# Patient Record
Sex: Male | Born: 1975 | Hispanic: Yes | Marital: Married | Smoking: Never smoker
Health system: Southern US, Community
[De-identification: ages and names within clinical notes are randomized; demographics above are authoritative.]

---

## 2020-03-10 ENCOUNTER — Other Ambulatory Visit: Payer: Self-pay

## 2020-03-10 ENCOUNTER — Emergency Department
Admission: EM | Admit: 2020-03-10 | Discharge: 2020-03-10 | Disposition: A | Discharge status: Home / Self Care | Payer: HRSA Program | Attending: Emergency Medicine | Admitting: Emergency Medicine

## 2020-03-10 ENCOUNTER — Encounter: Payer: Self-pay | Admitting: Intensive Care

## 2020-03-10 DIAGNOSIS — U071 COVID-19: Secondary | ICD-10-CM | POA: Insufficient documentation

## 2020-03-10 DIAGNOSIS — Z5321 Procedure and treatment not carried out due to patient leaving prior to being seen by health care provider: Secondary | ICD-10-CM | POA: Insufficient documentation

## 2020-03-10 DIAGNOSIS — R05 Cough: Secondary | ICD-10-CM | POA: Diagnosis present

## 2020-03-10 LAB — SARS CORONAVIRUS 2 BY RT PCR (HOSPITAL ORDER, PERFORMED IN ~~LOC~~ HOSPITAL LAB): SARS Coronavirus 2: POSITIVE — AB

## 2020-03-10 MED ORDER — ACETAMINOPHEN 325 MG PO TABS
650.0000 mg | ORAL_TABLET | Freq: Once | ORAL | Status: AC | PRN
  Administered 2020-03-10: 650 mg via ORAL
  Filled 2020-03-10: qty 2

## 2020-03-10 NOTE — ED Triage Notes (Signed)
Patient spanish speaking. Using interpretor on a stick for triage. Patient reports being told Thursday he had a URI. C/o headache, bilateral eye pain, cough, and fever. Was not checked for covid at clinic he was seen at on Thursday.

## 2020-03-14 ENCOUNTER — Telehealth: Payer: Self-pay | Admitting: Emergency Medicine

## 2020-03-14 NOTE — Telephone Encounter (Signed)
Called patient due to lwot to inquire about condition and follow up plans. He had covid test done during triage and it was positive.  Maritza [facility] interpretter was on the call.  She was able to leave a voicemail with my number.

## 2020-03-18 ENCOUNTER — Emergency Department: Payer: HRSA Program

## 2020-03-18 ENCOUNTER — Inpatient Hospital Stay
Admission: EM | Admit: 2020-03-18 | Discharge: 2020-04-26 | DRG: 207 | Disposition: E | Payer: HRSA Program | Attending: Pulmonary Disease | Admitting: Pulmonary Disease

## 2020-03-18 ENCOUNTER — Other Ambulatory Visit: Payer: Self-pay

## 2020-03-18 DIAGNOSIS — J80 Acute respiratory distress syndrome: Secondary | ICD-10-CM | POA: Diagnosis present

## 2020-03-18 DIAGNOSIS — Z7189 Other specified counseling: Secondary | ICD-10-CM

## 2020-03-18 DIAGNOSIS — J1282 Pneumonia due to coronavirus disease 2019: Secondary | ICD-10-CM | POA: Diagnosis present

## 2020-03-18 DIAGNOSIS — R739 Hyperglycemia, unspecified: Secondary | ICD-10-CM | POA: Diagnosis present

## 2020-03-18 DIAGNOSIS — B957 Other staphylococcus as the cause of diseases classified elsewhere: Secondary | ICD-10-CM | POA: Diagnosis not present

## 2020-03-18 DIAGNOSIS — E875 Hyperkalemia: Secondary | ICD-10-CM | POA: Diagnosis present

## 2020-03-18 DIAGNOSIS — N179 Acute kidney failure, unspecified: Secondary | ICD-10-CM | POA: Diagnosis present

## 2020-03-18 DIAGNOSIS — J9600 Acute respiratory failure, unspecified whether with hypoxia or hypercapnia: Secondary | ICD-10-CM

## 2020-03-18 DIAGNOSIS — Z789 Other specified health status: Secondary | ICD-10-CM

## 2020-03-18 DIAGNOSIS — I313 Pericardial effusion (noninflammatory): Secondary | ICD-10-CM | POA: Diagnosis not present

## 2020-03-18 DIAGNOSIS — I248 Other forms of acute ischemic heart disease: Secondary | ICD-10-CM | POA: Diagnosis present

## 2020-03-18 DIAGNOSIS — I214 Non-ST elevation (NSTEMI) myocardial infarction: Secondary | ICD-10-CM

## 2020-03-18 DIAGNOSIS — J9601 Acute respiratory failure with hypoxia: Secondary | ICD-10-CM

## 2020-03-18 DIAGNOSIS — E878 Other disorders of electrolyte and fluid balance, not elsewhere classified: Secondary | ICD-10-CM

## 2020-03-18 DIAGNOSIS — J690 Pneumonitis due to inhalation of food and vomit: Secondary | ICD-10-CM | POA: Diagnosis not present

## 2020-03-18 DIAGNOSIS — E871 Hypo-osmolality and hyponatremia: Secondary | ICD-10-CM

## 2020-03-18 DIAGNOSIS — U071 COVID-19: Principal | ICD-10-CM

## 2020-03-18 DIAGNOSIS — Z515 Encounter for palliative care: Secondary | ICD-10-CM

## 2020-03-18 DIAGNOSIS — Z66 Do not resuscitate: Secondary | ICD-10-CM | POA: Diagnosis not present

## 2020-03-18 DIAGNOSIS — Z9289 Personal history of other medical treatment: Secondary | ICD-10-CM

## 2020-03-18 DIAGNOSIS — D72829 Elevated white blood cell count, unspecified: Secondary | ICD-10-CM

## 2020-03-18 LAB — CBC WITH DIFFERENTIAL/PLATELET
Abs Immature Granulocytes: 0.72 10*3/uL — ABNORMAL HIGH (ref 0.00–0.07)
Basophils Absolute: 0.1 10*3/uL (ref 0.0–0.1)
Basophils Relative: 0 %
Eosinophils Absolute: 0 10*3/uL (ref 0.0–0.5)
Eosinophils Relative: 0 %
HCT: 41.1 % (ref 39.0–52.0)
Hemoglobin: 14.4 g/dL (ref 13.0–17.0)
Immature Granulocytes: 5 %
Lymphocytes Relative: 6 %
Lymphs Abs: 0.9 10*3/uL (ref 0.7–4.0)
MCH: 31 pg (ref 26.0–34.0)
MCHC: 35 g/dL (ref 30.0–36.0)
MCV: 88.4 fL (ref 80.0–100.0)
Monocytes Absolute: 0.6 10*3/uL (ref 0.1–1.0)
Monocytes Relative: 4 %
Neutro Abs: 13.1 10*3/uL — ABNORMAL HIGH (ref 1.7–7.7)
Neutrophils Relative %: 85 %
Platelets: 351 10*3/uL (ref 150–400)
RBC: 4.65 MIL/uL (ref 4.22–5.81)
RDW: 12.9 % (ref 11.5–15.5)
Smear Review: NORMAL
WBC: 14.9 10*3/uL — ABNORMAL HIGH (ref 4.0–10.5)
nRBC: 0.5 % — ABNORMAL HIGH (ref 0.0–0.2)

## 2020-03-18 LAB — COMPREHENSIVE METABOLIC PANEL
ALT: 74 U/L — ABNORMAL HIGH (ref 0–44)
AST: 67 U/L — ABNORMAL HIGH (ref 15–41)
Albumin: 3.3 g/dL — ABNORMAL LOW (ref 3.5–5.0)
Alkaline Phosphatase: 79 U/L (ref 38–126)
Anion gap: 14 (ref 5–15)
BUN: 15 mg/dL (ref 6–20)
CO2: 24 mmol/L (ref 22–32)
Calcium: 8.2 mg/dL — ABNORMAL LOW (ref 8.9–10.3)
Chloride: 89 mmol/L — ABNORMAL LOW (ref 98–111)
Creatinine, Ser: 0.96 mg/dL (ref 0.61–1.24)
GFR calc Af Amer: >60 mL/min (ref >60)
GFR calc non Af Amer: >60 mL/min (ref >60)
Glucose, Bld: 178 mg/dL — ABNORMAL HIGH (ref 70–99)
Potassium: 3.8 mmol/L (ref 3.5–5.1)
Sodium: 127 mmol/L — ABNORMAL LOW (ref 135–145)
Total Bilirubin: 0.8 mg/dL (ref 0.3–1.2)
Total Protein: 7.2 g/dL (ref 6.5–8.1)

## 2020-03-18 LAB — BLOOD GAS, VENOUS
Acid-Base Excess: 5 mmol/L — ABNORMAL HIGH (ref 0.0–2.0)
Bicarbonate: 29.1 mmol/L — ABNORMAL HIGH (ref 20.0–28.0)
O2 Saturation: 42.6 %
Patient temperature: 37
pCO2, Ven: 40 mmHg — ABNORMAL LOW (ref 44.0–60.0)
pH, Ven: 7.47 — ABNORMAL HIGH (ref 7.250–7.430)
pO2, Ven: <31 mmHg — CL (ref 32.0–45.0)

## 2020-03-18 LAB — TROPONIN I (HIGH SENSITIVITY): Troponin I (High Sensitivity): 128 ng/L (ref <18)

## 2020-03-18 LAB — LACTIC ACID, PLASMA: Lactic Acid, Venous: 1.5 mmol/L (ref 0.5–1.9)

## 2020-03-18 LAB — MAGNESIUM: Magnesium: 1.9 mg/dL (ref 1.7–2.4)

## 2020-03-18 LAB — BRAIN NATRIURETIC PEPTIDE: B Natriuretic Peptide: 133.5 pg/mL — ABNORMAL HIGH (ref 0.0–100.0)

## 2020-03-18 LAB — PROCALCITONIN: Procalcitonin: 0.78 ng/mL

## 2020-03-18 MED ORDER — ONDANSETRON HCL 4 MG/2ML IJ SOLN
4.0000 mg | Freq: Four times a day (QID) | INTRAMUSCULAR | Status: DC | PRN
  Filled 2020-03-18 (×2): qty 2

## 2020-03-18 MED ORDER — SODIUM CHLORIDE 0.9 % IV SOLN
200.0000 mg | Freq: Once | INTRAVENOUS | Status: AC
  Administered 2020-03-18: 200 mg via INTRAVENOUS
  Filled 2020-03-18: qty 200

## 2020-03-18 MED ORDER — ASCORBIC ACID 500 MG PO TABS
500.0000 mg | ORAL_TABLET | Freq: Every day | ORAL | Status: DC
  Administered 2020-03-19 – 2020-03-30 (×12): 500 mg via ORAL
  Filled 2020-03-18 (×12): qty 1

## 2020-03-18 MED ORDER — DEXAMETHASONE SODIUM PHOSPHATE 10 MG/ML IJ SOLN
6.0000 mg | INTRAMUSCULAR | Status: AC
  Administered 2020-03-18 – 2020-03-27 (×9): 6 mg via INTRAVENOUS
  Filled 2020-03-18 (×6): qty 0.6
  Filled 2020-03-18: qty 1
  Filled 2020-03-18 (×3): qty 0.6

## 2020-03-18 MED ORDER — ENOXAPARIN SODIUM 40 MG/0.4ML ~~LOC~~ SOLN
40.0000 mg | SUBCUTANEOUS | Status: DC
  Administered 2020-03-19 (×2): 40 mg via SUBCUTANEOUS
  Filled 2020-03-18 (×2): qty 0.4

## 2020-03-18 MED ORDER — SODIUM CHLORIDE 0.9 % IV SOLN
500.0000 mg | INTRAVENOUS | Status: DC
  Administered 2020-03-19 – 2020-03-20 (×3): 500 mg via INTRAVENOUS
  Filled 2020-03-18 (×4): qty 500

## 2020-03-18 MED ORDER — POLYETHYLENE GLYCOL 3350 17 G PO PACK
17.0000 g | PACK | Freq: Every day | ORAL | Status: DC | PRN
  Administered 2020-03-29: 17 g via ORAL
  Filled 2020-03-18: qty 1

## 2020-03-18 MED ORDER — SODIUM CHLORIDE 0.9 % IV SOLN
100.0000 mg | Freq: Every day | INTRAVENOUS | Status: AC
  Administered 2020-03-19 – 2020-03-22 (×4): 100 mg via INTRAVENOUS
  Filled 2020-03-18: qty 100
  Filled 2020-03-18: qty 20
  Filled 2020-03-18 (×2): qty 100

## 2020-03-18 MED ORDER — ALBUTEROL SULFATE HFA 108 (90 BASE) MCG/ACT IN AERS
1.0000 | INHALATION_SPRAY | RESPIRATORY_TRACT | Status: DC | PRN
  Administered 2020-03-22 (×2): 2 via RESPIRATORY_TRACT
  Filled 2020-03-18: qty 6.7

## 2020-03-18 MED ORDER — ZINC SULFATE 220 (50 ZN) MG PO CAPS
220.0000 mg | ORAL_CAPSULE | Freq: Every day | ORAL | Status: DC
  Administered 2020-03-19 – 2020-03-25 (×7): 220 mg via ORAL
  Filled 2020-03-18 (×7): qty 1

## 2020-03-18 MED ORDER — DM-GUAIFENESIN ER 30-600 MG PO TB12
2.0000 | ORAL_TABLET | Freq: Two times a day (BID) | ORAL | Status: DC | PRN
  Administered 2020-03-21 – 2020-03-22 (×2): 2 via ORAL
  Filled 2020-03-18 (×2): qty 2

## 2020-03-18 MED ORDER — DOCUSATE SODIUM 100 MG PO CAPS
100.0000 mg | ORAL_CAPSULE | Freq: Two times a day (BID) | ORAL | Status: DC | PRN
  Administered 2020-03-22 – 2020-03-26 (×2): 100 mg via ORAL
  Filled 2020-03-18 (×3): qty 1

## 2020-03-18 MED ORDER — SODIUM CHLORIDE 0.9 % IV SOLN
1.0000 g | INTRAVENOUS | Status: DC
  Administered 2020-03-19 – 2020-03-20 (×3): 1 g via INTRAVENOUS
  Filled 2020-03-18: qty 1
  Filled 2020-03-18 (×3): qty 10

## 2020-03-18 MED ORDER — BARICITINIB 2 MG PO TABS
4.0000 mg | ORAL_TABLET | Freq: Every day | ORAL | Status: DC
  Administered 2020-03-19 – 2020-03-25 (×8): 4 mg via ORAL
  Filled 2020-03-18 (×6): qty 2
  Filled 2020-03-18: qty 4
  Filled 2020-03-18: qty 2

## 2020-03-18 MED ORDER — IOHEXOL 350 MG/ML SOLN
100.0000 mL | Freq: Once | INTRAVENOUS | Status: AC | PRN
  Administered 2020-03-18: 100 mL via INTRAVENOUS

## 2020-03-18 MED ORDER — BARICITINIB 2 MG PO TABS
2.0000 mg | ORAL_TABLET | Freq: Every day | ORAL | Status: DC
  Filled 2020-03-18: qty 1

## 2020-03-18 MED ORDER — ACETAMINOPHEN 325 MG PO TABS
650.0000 mg | ORAL_TABLET | ORAL | Status: DC | PRN
  Administered 2020-03-24 – 2020-03-29 (×14): 650 mg via ORAL
  Filled 2020-03-18 (×14): qty 2

## 2020-03-18 MED ORDER — ASPIRIN EC 325 MG PO TBEC
325.0000 mg | DELAYED_RELEASE_TABLET | Freq: Every day | ORAL | Status: DC
  Administered 2020-03-19 – 2020-03-25 (×7): 325 mg via ORAL
  Filled 2020-03-18 (×7): qty 1

## 2020-03-18 MED ORDER — ASPIRIN EC 325 MG PO TBEC: 325.0000 mg | DELAYED_RELEASE_TABLET | Freq: Every day | ORAL | Status: DC

## 2020-03-18 NOTE — H&P (Signed)
Name: Donald Mccall MRN: [id number] DOB: 05/09/1976    ADMISSION DATE:  04-02-2020 CONSULTATION DATE:  04/02/2020  REFERRING MD :  Dr. Katrinka Blazing  CHIEF COMPLAINT:  Acute Respiratory Distress  BRIEF PATIENT DESCRIPTION:  44 year old male admitted with acute hypoxic respiratory failure secondary to COVID-19 pneumonia.  SIGNIFICANT EVENTS  8/23: Presented to ED, PCCM asked to admit  STUDIES:  8/23: CTA Chest>>No evidence of pulmonary embolus. Extensive bilateral predominantly ground-glass opacities most compatible with pneumonia, likely COVID pneumonia.  CULTURES: SARS-CoV-2 PCR 8/15>> positive Blood culture x2 8/23>> Strep pneumo urinary antigen 8/23>> Legionella urinary antigen 8/23>  ANTIBIOTICS / ANTIVIRALS: Remdesivir 8/23>> Azithromycin 8/23>> Ceftriaxone 8/23>>  HISTORY OF PRESENT ILLNESS:   Donald Mccall is a 44 y.o. male with no noted past medical history who presents to [facility] ED on 04-02-2020 from home via EMS due to worsening shortness of breath with associated cough, fever, and fatigue.  He was diagnosed with COVID-19 on 03/10/2020.  He reports he has not received the COVID-19 vaccine.  He also endorses some nausea and vomiting, but denies diarrhea.  He denies chest pain, headache, palpitations, headache, sore throat, back pain, edema.  Upon presentation to the ED he was placed on HFNC and  vitals were temperature 100.1 orally, RR 26, HR 117, BP 123/74, and SpO2 89% on HFNC (100% FiO2).  Initial workup revealed WBC 14.0 (with Neutrophilia), procalcitonin 0.78, lactic acid 1.5, sodium 127, chloride 89, glucose 178, AST 67, ALT 74, BNP 133.5, high-sensitivity troponin 128.  CTA chest obtained which was negative for pulmonary embolus, did reveal extensive bilateral groundglass opacities compatible with multifocal pneumonia.  PCCM is asked to admit the patient to stepdown unit for further work-up and treatment of acute hypoxic respiratory failure in the setting of  COVID-19 pneumonia.  PAST MEDICAL HISTORY :   has no past medical history on file.  has no past surgical history on file. Prior to Admission medications   Not on File   No Known Allergies  FAMILY HISTORY:  family history is not on file. SOCIAL HISTORY:  reports that he has never smoked. He has never used smokeless tobacco. He reports previous alcohol use. He reports that he does not use drugs.   COVID-19 DISASTER DECLARATION:  FULL CONTACT PHYSICAL EXAMINATION WAS NOT POSSIBLE DUE TO TREATMENT OF COVID-19 AND  CONSERVATION OF PERSONAL PROTECTIVE EQUIPMENT, LIMITED EXAM FINDINGS INCLUDE-  Patient assessed or the symptoms described in the history of present illness.  In the context of the Global COVID-19 pandemic, which necessitated consideration that the patient might be at risk for infection with the SARS-CoV-2 virus that causes COVID-19, Institutional protocols and algorithms that pertain to the evaluation of patients at risk for COVID-19 are in a state of rapid change based on information released by regulatory bodies including the CDC and federal and state organizations. These policies and algorithms were followed during the patient's care while in hospital.  REVIEW OF SYSTEMS:  Positives in BOLD Constitutional: Negative for +fever, +chills, +weight loss, +malaise/fatigue and diaphoresis.  HENT: Negative for hearing loss, ear pain, nosebleeds, congestion, sore throat, neck pain, tinnitus and ear discharge.   Eyes: Negative for blurred vision, double vision, photophobia, pain, discharge and redness.  Respiratory: Negative for +cough, hemoptysis, +sputum production, +shortness of breath, wheezing and stridor.   Cardiovascular: Negative for chest pain, palpitations, orthopnea, claudication, leg swelling and PND.  Gastrointestinal: Negative for heartburn, +nausea, +vomiting, abdominal pain, diarrhea, constipation, blood in stool and melena.  Genitourinary: Negative for dysuria,  urgency, frequency, hematuria and flank pain.  Musculoskeletal: Negative for +myalgias, back pain, joint pain and falls.  Skin: Negative for itching and rash.  Neurological: Negative for dizziness, tingling, tremors, sensory change, speech change, focal weakness, seizures, loss of consciousness, weakness and headaches.  Endo/Heme/Allergies: Negative for environmental allergies and polydipsia. Does not bruise/bleed easily.  SUBJECTIVE:  Pt reports shortness of breath, cough, malaise/fatigue Denies chest pain, palpitations, abdominal pain, N/V/D currently On 100% BiPAP (tolerating well), being weaned to HFNC  VITAL SIGNS: Temp:  [100.1 F (37.8 C)] 100.1 F (37.8 C) (08/23 2159) Pulse Rate:  [117-122] 117 (08/23 2200) Resp:  [22-26] 26 (08/23 2200) BP: (123)/(74) 123/74 (08/23 2200) SpO2:  [77 %-89 %] 89 % (08/23 2200) FiO2 (%):  [100 %] 100 % (08/23 2200) Weight:  [98 kg] 98 kg (08/23 2201)  PHYSICAL EXAMINATION: General: Acutely ill-appearing male, sitting in bed, on BiPAP, in no acute distress Neuro: Awake, alert and oriented x4, follows commands, no focal deficits, speech clear HEENT: Atraumatic, normocephalic, neck supple, no JVD Cardiovascular: Tachycardia, regular rhythm, 2+ pulses Lungs: Unable to auscultate due to CAPR and PPE, even, mild tachypnea Abdomen: Soft, nontender, nondistended, no guarding or rebound tenderness Musculoskeletal: Normal bulk and tone, no deformities, no edema Skin: Warm and dry.  No obvious rashes, lesions, ulcerations  Recent Labs  Lab 03/09/2020 2204  NA 127*  K 3.8  CL 89*  CO2 24  BUN 15  CREATININE 0.96  GLUCOSE 178*   Recent Labs  Lab 03/17/2020 2204  HGB 14.4  HCT 41.1  WBC 14.9*  PLT 351   CT Angio Chest PE W and/or Wo Contrast  Result Date: 03/01/2020 CLINICAL DATA:  Respiratory distress. EXAM: CT ANGIOGRAPHY CHEST WITH CONTRAST TECHNIQUE: Multidetector CT imaging of the chest was performed using the standard protocol during  bolus administration of intravenous contrast. Multiplanar CT image reconstructions and MIPs were obtained to evaluate the vascular anatomy. CONTRAST:  OMNIPAQUE IOHEXOL 350 MG/ML SOLN COMPARISON:  None. FINDINGS: Cardiovascular: No filling defects in the pulmonary arteries to suggest pulmonary emboli. Heart is normal size. Aorta normal caliber. Mediastinum/Nodes: No mediastinal, hilar, or axillary adenopathy. Trachea and esophagus are unremarkable. Thyroid unremarkable. Lungs/Pleura: Extensive ground-glass airspace opacities throughout both lungs. More confluent consolidation in the right lower lobe. Findings most compatible with pneumonia. No effusions. Upper Abdomen: Imaging into the upper abdomen demonstrates no acute findings. Musculoskeletal: Chest wall soft tissues are unremarkable. No acute bony abnormality. Review of the MIP images confirms the above findings. IMPRESSION: No evidence of pulmonary embolus. Extensive bilateral predominantly ground-glass opacities most compatible with pneumonia, likely COVID pneumonia. Electronically Signed   By: Charlett Nose M.D.   On: 03/26/2020 23:02    ASSESSMENT / PLAN:  Acute Hypoxic Respiratory Failure in the setting of COVID-19 Pneumonia -Supplemental O2 as needed to maintain O2 sats greater than 85% -BiPAP, wean as tolerated -High risk for intubation -Follow intermittent chest x-ray and ABG as needed -CTA chest 8/23 NEGATIVE for PE -As needed bronchodilators via MDI -Antitussives -Pulmonary toilet -Self-proning as tolerated -Maintain euvolemia to net negative fluid status   COVID-19 Pneumonia ? Superimposed CAP -Monitor fever curve -Trend WBCs and procalcitonin -Follow cultures as above -Follow inflammatory markers: Ferritin, D-dimer, CRP, IL-6, LDH -Remdesivir (Day 1 out of 5) -IV Decadron -Vitamin C and zinc -Will place on Baricitinib -Will place on Azithromycin & Ceftriaxone due to elevated Procalcitonin -Maintain Airborne and  Contact precautions  Initiation: - Based on ACTT-2, COV-BARRIER and other available trial data, baricitinib  is being used under EUA by the FDA. The patient has no ESRD or AKI, known history of TB, severe neutropenia (ANC <500) or lymphopenia (ALC <200), or severe LFT elevations. They are not on DMARDs or probenecid, and are not pregnant.  - The option to use/refuse baricitinib treatment under FDA authorization (not approval), the significant known and potential risks and benefits, the extent to which these are unknown, and information regarding all available alternatives were discussed in detail. Specifically, the risk of VTE and secondary infections were discussed in detail with the patient and/or HCPOA. They consent to proceed with treatment.  For patients already started:    - Continue baricitinib, started 03/26/2020, for 14 days or until hospital discharge. Monitor Cr, LFTs, differential. Keep on VTE ppx. Renally dose if necessary (4mg  daily normal renal function, 2mg  daily if CrCl 19ml/min or higher, 1mg  daily if CrCl 47ml/min or higher).    Elevated Troponin, likely demand ischemia vs. NSTEMI -Continuous cardiac monitoring -Maintain MAP greater than 65 -Trend troponin until downtrending -Pt denies chest pain, EKG with nonspecific T wave changes (no evidence of acute ischemia)   Hyponatremia, likely hypotonic hypovolemic in setting of recent history of Nausea/Vomiting Hypochloremia -Monitor I&O's / urinary output -Follow BMP -Ensure adequate renal perfusion -Avoid nephrotoxic agents as able -Replace electrolytes as indicated -Cautious IVF due to Acute Hypoxic Respiratory Failure due to COVID-19       Pt's prognosis is guarded, he is high risk for intubation.  BEST PRACTICES DISPOSITION: STEPDOWN GOALS OF CARE: FULL CODE VTE PROPHYLAXIS: SQ LOVENOX UPDATES: UPDATED PT AT BEDSIDE 03/11/2020 with Hospital Interpreter at bedside.  , AGACNP-BC [facility] Pager: [phone]  03/05/2020, 11:08 PM

## 2020-03-18 NOTE — ED Provider Notes (Signed)
[facility] Emergency Department Provider Note  ____________________________________________   First MD Initiated Contact with Patient 10/29/19 2159     (approximate)  I have reviewed the triage vital signs and the nursing notes.   HISTORY  Chief Complaint Respiratory Distress   HPI Donald Mccall is a 44 y.o. male without significant past medical history who presents via EMS from home for worsening shortness of breath associated with cough, fever, and fatigue that is all been getting worse since he was diagnosed with Covid on 8/15.  Patient also endorses some nausea and vomiting but denies any diarrhea.  He denies any chest pain, dental pain, headache, earache, sore throat, back pain, extremity pain, rash, or acute complaints.  No clear alleviating or aggravating factors.  Patient states he has not been immunized.  He denies tobacco use, EtOH use, illicit drug use.  Denies taking any medications on a regular basis.  No other acute complaints at this time.  Patient is Spanish-speaking and history was obtained with Spanish-speaking nurse at bedside.         History reviewed. No pertinent past medical history.  Patient Active Problem List   Diagnosis Date Noted  . Pneumonia due to COVID-19 virus Dec 21, 2019    History reviewed. No pertinent surgical history.  Prior to Admission medications   Not on File    Allergies Patient has no known allergies.  History reviewed. No pertinent family history.  Social History Social History   Tobacco Use  . Smoking status: Never Smoker  . Smokeless tobacco: Never Used  Substance Use Topics  . Alcohol use: Not Currently  . Drug use: Never    Review of Systems  Review of Systems  Constitutional: Positive for chills, diaphoresis, fever, malaise/fatigue and weight loss.  HENT: Positive for congestion. Negative for sore throat.   Eyes: Negative for pain.  Respiratory: Positive for cough and sputum  production. Negative for stridor.   Cardiovascular: Positive for palpitations. Negative for chest pain.  Gastrointestinal: Positive for nausea and vomiting.  Musculoskeletal: Positive for myalgias.  Skin: Negative for rash.  Neurological: Positive for headaches. Negative for seizures and loss of consciousness.  Psychiatric/Behavioral: Negative for suicidal ideas.  All other systems reviewed and are negative.     ____________________________________________   PHYSICAL EXAM:  VITAL SIGNS: ED Triage Vitals [10/29/19 2159]  Enc Vitals Group     BP      Pulse      Resp      Temp      Temp src      SpO2 (!) 77 %     Weight      Height      Head Circumference      Peak Flow      Pain Score      Pain Loc      Pain Edu?      Excl. in GC?    Vitals:   10/29/19 2159 10/29/19 2200  BP:  123/74  Pulse: (!) 122 (!) 117  Resp: (!) 22 (!) 26  Temp: 100.1 F (37.8 C)   SpO2: (!) 77% (!) 89%   Physical Exam Vitals and nursing note reviewed.  Constitutional:      General: He is in acute distress.     Appearance: He is well-developed.  HENT:     Head: Normocephalic and atraumatic.     Right Ear: External ear normal.     Left Ear: External ear normal.  Nose: Nose normal.     Mouth/Throat:     Mouth: Mucous membranes are dry.  Eyes:     Conjunctiva/sclera: Conjunctivae normal.  Cardiovascular:     Rate and Rhythm: Regular rhythm. Tachycardia present.     Heart sounds: No murmur heard.   Pulmonary:     Effort: Tachypnea, accessory muscle usage and respiratory distress present.     Breath sounds: Examination of the right-middle field reveals rhonchi. Examination of the left-middle field reveals rhonchi. Examination of the right-lower field reveals rhonchi. Examination of the left-lower field reveals rhonchi. Rhonchi present.  Abdominal:     Palpations: Abdomen is soft.     Tenderness: There is no abdominal tenderness. There is no right CVA tenderness or left CVA  tenderness.  Musculoskeletal:     Cervical back: Neck supple.     Right lower leg: No edema.     Left lower leg: No edema.  Skin:    General: Skin is warm and dry.  Neurological:     Mental Status: He is alert and oriented to person, place, and time.  Psychiatric:        Mood and Affect: Mood normal.      ____________________________________________   LABS (all labs ordered are listed, but only abnormal results are displayed)  Labs Reviewed  CBC WITH DIFFERENTIAL/PLATELET - Abnormal; Notable for the following components:      Result Value   WBC 14.9 (*)    nRBC 0.5 (*)    Neutro Abs 13.1 (*)    Abs Immature Granulocytes 0.72 (*)    All other components within normal limits  COMPREHENSIVE METABOLIC PANEL - Abnormal; Notable for the following components:   Sodium 127 (*)    Chloride 89 (*)    Glucose, Bld 178 (*)    Calcium 8.2 (*)    Albumin 3.3 (*)    AST 67 (*)    ALT 74 (*)    All other components within normal limits  BRAIN NATRIURETIC PEPTIDE - Abnormal; Notable for the following components:   B Natriuretic Peptide 133.5 (*)    All other components within normal limits  BLOOD GAS, VENOUS - Abnormal; Notable for the following components:   pH, Ven 7.47 (*)    pCO2, Ven 40 (*)    pO2, Ven <31.0 (*)    Bicarbonate 29.1 (*)    Acid-Base Excess 5.0 (*)    All other components within normal limits  TROPONIN I (HIGH SENSITIVITY) - Abnormal; Notable for the following components:   Troponin I (High Sensitivity) 128 (*)    All other components within normal limits  CULTURE, BLOOD (ROUTINE X 2)  CULTURE, BLOOD (ROUTINE X 2)  MAGNESIUM  PROCALCITONIN  LACTIC ACID, PLASMA  LACTIC ACID, PLASMA  PROTIME-INR  APTT  URINALYSIS, COMPLETE (UACMP) WITH MICROSCOPIC  C-REACTIVE PROTEIN  PROCALCITONIN  CBC WITH DIFFERENTIAL/PLATELET  COMPREHENSIVE METABOLIC PANEL  FIBRIN DERIVATIVES D-DIMER ([facility] ONLY)  C-REACTIVE PROTEIN  FERRITIN  HIV ANTIBODY (ROUTINE TESTING W  REFLEX)   ____________________________________________  EKG  Sinus tachycardia with a ventricular rate of 120 with very poor baseline tracings and borderline prolonged QTC with normal axis, nonspecific T wave changes, no other clear evidence of acute ischemia or other significant underlying arrhythmia. ____________________________________________  RADIOLOGY  ED MD interpretation: No PE.  Bilateral groundglass opacities.  No evidence of dissection, aneurysm, or other acute intrathoracic process.  Official radiology report(s): CT Angio Chest PE W and/or Wo Contrast  Result Date: 2020-04-14 CLINICAL DATA:  Respiratory distress. EXAM: CT ANGIOGRAPHY CHEST WITH CONTRAST TECHNIQUE: Multidetector CT imaging of the chest was performed using the standard protocol during bolus administration of intravenous contrast. Multiplanar CT image reconstructions and MIPs were obtained to evaluate the vascular anatomy. CONTRAST:  OMNIPAQUE IOHEXOL 350 MG/ML SOLN COMPARISON:  None. FINDINGS: Cardiovascular: No filling defects in the pulmonary arteries to suggest pulmonary emboli. Heart is normal size. Aorta normal caliber. Mediastinum/Nodes: No mediastinal, hilar, or axillary adenopathy. Trachea and esophagus are unremarkable. Thyroid unremarkable. Lungs/Pleura: Extensive ground-glass airspace opacities throughout both lungs. More confluent consolidation in the right lower lobe. Findings most compatible with pneumonia. No effusions. Upper Abdomen: Imaging into the upper abdomen demonstrates no acute findings. Musculoskeletal: Chest wall soft tissues are unremarkable. No acute bony abnormality. Review of the MIP images confirms the above findings. IMPRESSION: No evidence of pulmonary embolus. Extensive bilateral predominantly ground-glass opacities most compatible with pneumonia, likely COVID pneumonia. Electronically Signed   By: Charlett Nose M.D.   On: 2020-04-16 23:02     ____________________________________________   PROCEDURES  Procedure(s) performed (including Critical Care):  .1-3 Lead EKG Interpretation Performed by: Gilles Chiquito, MD Authorized by: Gilles Chiquito, MD     Interpretation: normal     ECG rate assessment: tachycardic     Rhythm: sinus rhythm     Ectopy: none     Conduction: normal   .Critical Care Performed by: Gilles Chiquito, MD Authorized by: Gilles Chiquito, MD   Critical care provider statement:    Critical care time (minutes):  75   Critical care time was exclusive of:  Separately billable procedures and treating other patients   Critical care was necessary to treat or prevent imminent or life-threatening deterioration of the following conditions:  Respiratory failure and dehydration   Critical care was time spent personally by me on the following activities:  Discussions with consultants, evaluation of patient's response to treatment, examination of patient, ordering and performing treatments and interventions, ordering and review of laboratory studies, ordering and review of radiographic studies, pulse oximetry, re-evaluation of patient's condition, obtaining history from patient or surrogate and review of old charts     ____________________________________________   INITIAL IMPRESSION / ASSESSMENT AND PLAN / ED COURSE        Overall patient's history, exam, and ED work-up is concerning for acute hypoxic respiratory failure and NSTEMI secondary to Covid pneumonia.  Patient is also noted to be hyponatremic.  There is no evidence of PE on CTA chest, dissection, aneurysm, pneumothorax, or significant effusion.  Patient is not acutely symptomatic anemia based on hemoglobin WNL.  Elevated troponin likely secondary to demand ischemia in the setting of severe Covid infection.  VBG does not show evidence of hypercarbic respiratory failure.  BNP is slightly elevated at 133.  Unclear etiology for patient's  hyponatremia although NA is 127 he is also noted to be hypochloremic at 189.  It is possible this is secondary to GI losses.  Patient was immediately placed on high flow oxygen supplementation as well as nonrebreather.  Below noted medications initiated in the ED.  We will plan to admit to medical ICU for further evaluation management of COVID-19 pneumonia and severe hypoxic respiratory failure with NSTEMI and metabolic derangements.  Medications  dexamethasone (DECADRON) injection 6 mg (6 mg Intravenous Given 04-16-20 2217)  remdesivir 200 mg in sodium chloride 0.9% 250 mL IVPB (has no administration in time range)    Followed by  remdesivir 100 mg in sodium chloride 0.9 %  100 mL IVPB (has no administration in time range)  ascorbic acid (VITAMIN C) tablet 500 mg (has no administration in time range)  zinc sulfate capsule 220 mg (has no administration in time range)  baricitinib (OLUMIANT) tablet 2 mg (has no administration in time range)  albuterol (VENTOLIN HFA) 108 (90 Base) MCG/ACT inhaler 1-2 puff (has no administration in time range)  dextromethorphan-guaiFENesin (MUCINEX DM) 30-600 MG per 12 hr tablet 2 tablet (has no administration in time range)  aspirin EC tablet 325 mg (has no administration in time range)  docusate sodium (COLACE) capsule 100 mg (has no administration in time range)  polyethylene glycol (MIRALAX / GLYCOLAX) packet 17 g (has no administration in time range)  enoxaparin (LOVENOX) injection 40 mg (has no administration in time range)  acetaminophen (TYLENOL) tablet 650 mg (has no administration in time range)  ondansetron (ZOFRAN) injection 4 mg (has no administration in time range)  iohexol (OMNIPAQUE) 350 MG/ML injection 100 mL (100 mLs Intravenous Contrast Given 03/23/20 2225)   ____________________________________________   FINAL CLINICAL IMPRESSION(S) / ED DIAGNOSES  Final diagnoses:  COVID-19  Acute respiratory failure with hypoxia (HCC)  NSTEMI (non-ST  elevated myocardial infarction) (HCC)  Hyponatremia  Hypochloremia  Leukocytosis, unspecified type     ED Discharge Orders    None       Note:  This document was prepared using Dragon voice recognition software and may include unintentional dictation errors.   Gilles Chiquito, MD 03-23-2020 [phone]

## 2020-03-18 NOTE — ED Triage Notes (Signed)
Pt arrives from home via [facility] with complaint of respiratory distress. Pt RA sat was in the 50's per EMS, with increase to 70's 15L NRB.  Pt satting 81% NRB on arrival. Family reports symptoms for worsened over past 3 days. Pt with labored breathing on arrival Pt reports n/v, denies diarrhea   HR 120 BP 120/84   Pt reports testing covid positive last monday

## 2020-03-18 NOTE — ED Notes (Signed)
Pt placed on heated HFNC, with improvement in breathing.  Pt still dyspneic, with SpO2 ranging from 87-91%

## 2020-03-18 NOTE — Progress Notes (Signed)
Remdesivir - Pharmacy Brief Note   O:  CXR: pending SpO2: 88% on HFNC   A/P:  Remdesivir 200 mg IVPB once followed by 100 mg IVPB daily x 4 days.   Thomasene Ripple, PharmD, BCPS Clinical Pharmacist 03/21/2020 10:11 PM

## 2020-03-19 LAB — CBC WITH DIFFERENTIAL/PLATELET
Abs Immature Granulocytes: 0.62 10*3/uL — ABNORMAL HIGH (ref 0.00–0.07)
Basophils Absolute: 0.1 10*3/uL (ref 0.0–0.1)
Basophils Relative: 0 %
Eosinophils Absolute: 0 10*3/uL (ref 0.0–0.5)
Eosinophils Relative: 0 %
HCT: 41.5 % (ref 39.0–52.0)
Hemoglobin: 14.7 g/dL (ref 13.0–17.0)
Immature Granulocytes: 4 %
Lymphocytes Relative: 3 %
Lymphs Abs: 0.6 10*3/uL — ABNORMAL LOW (ref 0.7–4.0)
MCH: 30.8 pg (ref 26.0–34.0)
MCHC: 35.4 g/dL (ref 30.0–36.0)
MCV: 86.8 fL (ref 80.0–100.0)
Monocytes Absolute: 0.4 10*3/uL (ref 0.1–1.0)
Monocytes Relative: 3 %
Neutro Abs: 15.1 10*3/uL — ABNORMAL HIGH (ref 1.7–7.7)
Neutrophils Relative %: 90 %
Platelets: 344 10*3/uL (ref 150–400)
RBC: 4.78 MIL/uL (ref 4.22–5.81)
RDW: 13 % (ref 11.5–15.5)
WBC: 16.8 10*3/uL — ABNORMAL HIGH (ref 4.0–10.5)
nRBC: 0.4 % — ABNORMAL HIGH (ref 0.0–0.2)

## 2020-03-19 LAB — URINALYSIS, COMPLETE (UACMP) WITH MICROSCOPIC
Bacteria, UA: NONE SEEN
Bilirubin Urine: NEGATIVE
Glucose, UA: NEGATIVE mg/dL
Hgb urine dipstick: NEGATIVE
Ketones, ur: NEGATIVE mg/dL
Leukocytes,Ua: NEGATIVE
Nitrite: NEGATIVE
Protein, ur: NEGATIVE mg/dL
Specific Gravity, Urine: 1.036 — ABNORMAL HIGH (ref 1.005–1.030)
Squamous Epithelial / HPF: NONE SEEN (ref 0–5)
pH: 5 (ref 5.0–8.0)

## 2020-03-19 LAB — MAGNESIUM: Magnesium: 2.3 mg/dL (ref 1.7–2.4)

## 2020-03-19 LAB — COMPREHENSIVE METABOLIC PANEL
ALT: 68 U/L — ABNORMAL HIGH (ref 0–44)
AST: 59 U/L — ABNORMAL HIGH (ref 15–41)
Albumin: 3.2 g/dL — ABNORMAL LOW (ref 3.5–5.0)
Alkaline Phosphatase: 89 U/L (ref 38–126)
Anion gap: 12 (ref 5–15)
BUN: 14 mg/dL (ref 6–20)
CO2: 28 mmol/L (ref 22–32)
Calcium: 8.3 mg/dL — ABNORMAL LOW (ref 8.9–10.3)
Chloride: 94 mmol/L — ABNORMAL LOW (ref 98–111)
Creatinine, Ser: 0.93 mg/dL (ref 0.61–1.24)
GFR calc Af Amer: >60 mL/min (ref >60)
GFR calc non Af Amer: >60 mL/min (ref >60)
Glucose, Bld: 202 mg/dL — ABNORMAL HIGH (ref 70–99)
Potassium: 4.4 mmol/L (ref 3.5–5.1)
Sodium: 134 mmol/L — ABNORMAL LOW (ref 135–145)
Total Bilirubin: 0.6 mg/dL (ref 0.3–1.2)
Total Protein: 7 g/dL (ref 6.5–8.1)

## 2020-03-19 LAB — C-REACTIVE PROTEIN
CRP: 18.2 mg/dL — ABNORMAL HIGH (ref <1.0)
CRP: 20.8 mg/dL — ABNORMAL HIGH (ref <1.0)

## 2020-03-19 LAB — FIBRIN DERIVATIVES D-DIMER (ARMC ONLY): Fibrin derivatives D-dimer (ARMC): 1490.65 ng/mL (FEU) — ABNORMAL HIGH (ref 0.00–499.00)

## 2020-03-19 LAB — PROTIME-INR
INR: 1.1 (ref 0.8–1.2)
Prothrombin Time: 13.4 seconds (ref 11.4–15.2)

## 2020-03-19 LAB — PROCALCITONIN: Procalcitonin: 0.93 ng/mL

## 2020-03-19 LAB — APTT: aPTT: 27 seconds (ref 24–36)

## 2020-03-19 LAB — TROPONIN I (HIGH SENSITIVITY): Troponin I (High Sensitivity): 88 ng/L — ABNORMAL HIGH (ref <18)

## 2020-03-19 LAB — FERRITIN: Ferritin: 3672 ng/mL — ABNORMAL HIGH (ref 24–336)

## 2020-03-19 LAB — LACTIC ACID, PLASMA: Lactic Acid, Venous: 1.2 mmol/L (ref 0.5–1.9)

## 2020-03-19 LAB — STREP PNEUMONIAE URINARY ANTIGEN: Strep Pneumo Urinary Antigen: NEGATIVE

## 2020-03-19 LAB — PHOSPHORUS: Phosphorus: 3.1 mg/dL (ref 2.5–4.6)

## 2020-03-19 LAB — HIV ANTIBODY (ROUTINE TESTING W REFLEX): HIV Screen 4th Generation wRfx: NONREACTIVE

## 2020-03-19 MED ORDER — CHLORHEXIDINE GLUCONATE CLOTH 2 % EX PADS
6.0000 | MEDICATED_PAD | Freq: Every day | CUTANEOUS | Status: AC
  Administered 2020-03-22 – 2020-03-23 (×2): 6 via TOPICAL
  Filled 2020-03-19 (×2): qty 6

## 2020-03-19 MED ORDER — CHLORHEXIDINE GLUCONATE CLOTH 2 % EX PADS
6.0000 | MEDICATED_PAD | Freq: Every day | CUTANEOUS | Status: DC
  Administered 2020-03-22 – 2020-03-30 (×7): 6 via TOPICAL

## 2020-03-19 MED ORDER — MUPIROCIN 2 % EX OINT
1.0000 "application " | TOPICAL_OINTMENT | Freq: Two times a day (BID) | CUTANEOUS | Status: AC
  Administered 2020-03-19 – 2020-03-23 (×10): 1 via NASAL
  Filled 2020-03-19: qty 22

## 2020-03-19 NOTE — ED Notes (Signed)
Pt tolerating bipap well, states his breathing has improved.

## 2020-03-19 NOTE — ED Notes (Signed)
Pt placed back on bipap per inability to maintain spo2 above 89%  Pt acknowledges wife's request to update on his status, says he has a phone with signal and will update her. Declines this RN updating with interpreter

## 2020-03-19 NOTE — ED Notes (Signed)
Pt appears to be sleeping and comfortable at this time.  

## 2020-03-19 NOTE — ED Notes (Signed)
No change in condition, pt awaiting hospital bed availability.  

## 2020-03-19 NOTE — ED Notes (Signed)
NRB added onto HFNC per spo2 of 88-90

## 2020-03-19 NOTE — Progress Notes (Signed)
Pharmacy Electrolyte Monitoring Consult:  Pharmacy consulted to assist in monitoring and replacing electrolytes in this 44 y.o. male admitted on 03/20/2020 with Respiratory Distress   Labs:  Sodium (mmol/L)  Date Value  03/19/2020 134 (L)   Potassium (mmol/L)  Date Value  03/19/2020 4.4   Magnesium (mg/dL)  Date Value  43/60/6770 2.3   Phosphorus (mg/dL)  Date Value  34/09/5246 3.1   Calcium (mg/dL)  Date Value  18/59/0931 8.3 (L)   Albumin (g/dL)  Date Value  07/11/2445 3.2 (L)    Assessment/Plan: Goal: electrolytes WNL  Electrolytes WNL. No replacement indicated at this time. Continue to monitor.  Laureen Ochs, PharmD Clinical Pharmacist 03/19/2020 11:59 AM

## 2020-03-19 NOTE — Progress Notes (Signed)
Pharmacy Electrolyte Monitoring Consult:  Pharmacy consulted to assist in monitoring and replacing electrolytes in this 44 y.o. male admitted on 19-Mar-2020 with Respiratory Distress   Labs:  Sodium (mmol/L)  Date Value  2020-03-19 127 (L)   Potassium (mmol/L)  Date Value  2020/03/19 3.8   Magnesium (mg/dL)  Date Value  94/85/4627 1.9   Calcium (mg/dL)  Date Value  03/50/0938 8.2 (L)   Albumin (g/dL)  Date Value  18/29/9371 3.3 (L)    Assessment/Plan: Goal Na 135 - 145 K > 4.0 Mg > 2.0 Phos 2.5 - 4.5  Patient is currently hyponatremic not currently on fluids d/t COVID pneumonia w/ high O2 requirements (89 - 95% O2 on 55L HFNC 100% FiO2) for fear of pulmonary edema; however, on dexamethasone, which may help to increase sodium. K WNL, Mg 1.9 WNL, Mg/Phos pending for am labs will continue to monitor.  Thomasene Ripple, PharmD, BCPS Clinical Pharmacist 03/19/2020 3:34 AM

## 2020-03-19 NOTE — ED Notes (Signed)
Report received from Noah RN. Patient care assumed. Patient/RN introduction complete. Will continue to monitor.  

## 2020-03-19 NOTE — ED Notes (Signed)
Pt taken off bipap, placed on HFNC per pt request

## 2020-03-19 NOTE — Progress Notes (Signed)
CODE SEPSIS - PHARMACY COMMUNICATION  **Broad Spectrum Antibiotics should be administered within 1 hour of Sepsis diagnosis**  Time Code Sepsis Called/Page Received: 2202  Antibiotics Ordered: azithro/ceftriaxone  Time of 1st antibiotic administration: 0019  Additional action taken by pharmacy:   If necessary, Name of Provider/Nurse Contacted:     Thomasene Ripple ,PharmD Clinical Pharmacist  03/19/2020  2:44 AM

## 2020-03-20 DIAGNOSIS — J9601 Acute respiratory failure with hypoxia: Secondary | ICD-10-CM

## 2020-03-20 LAB — CBC WITH DIFFERENTIAL/PLATELET
Abs Immature Granulocytes: 0.45 10*3/uL — ABNORMAL HIGH (ref 0.00–0.07)
Basophils Absolute: 0.1 10*3/uL (ref 0.0–0.1)
Basophils Relative: 1 %
Eosinophils Absolute: 0 10*3/uL (ref 0.0–0.5)
Eosinophils Relative: 0 %
HCT: 40.8 % (ref 39.0–52.0)
Hemoglobin: 14.2 g/dL (ref 13.0–17.0)
Immature Granulocytes: 4 %
Lymphocytes Relative: 4 %
Lymphs Abs: 0.5 10*3/uL — ABNORMAL LOW (ref 0.7–4.0)
MCH: 30.9 pg (ref 26.0–34.0)
MCHC: 34.8 g/dL (ref 30.0–36.0)
MCV: 88.7 fL (ref 80.0–100.0)
Monocytes Absolute: 0.5 10*3/uL (ref 0.1–1.0)
Monocytes Relative: 4 %
Neutro Abs: 9.8 10*3/uL — ABNORMAL HIGH (ref 1.7–7.7)
Neutrophils Relative %: 87 %
Platelets: 414 10*3/uL — ABNORMAL HIGH (ref 150–400)
RBC: 4.6 MIL/uL (ref 4.22–5.81)
RDW: 13.3 % (ref 11.5–15.5)
WBC: 11.3 10*3/uL — ABNORMAL HIGH (ref 4.0–10.5)
nRBC: 0.6 % — ABNORMAL HIGH (ref 0.0–0.2)

## 2020-03-20 LAB — COMPREHENSIVE METABOLIC PANEL
ALT: 60 U/L — ABNORMAL HIGH (ref 0–44)
AST: 43 U/L — ABNORMAL HIGH (ref 15–41)
Albumin: 3.1 g/dL — ABNORMAL LOW (ref 3.5–5.0)
Alkaline Phosphatase: 88 U/L (ref 38–126)
Anion gap: 12 (ref 5–15)
BUN: 22 mg/dL — ABNORMAL HIGH (ref 6–20)
CO2: 28 mmol/L (ref 22–32)
Calcium: 8.7 mg/dL — ABNORMAL LOW (ref 8.9–10.3)
Chloride: 97 mmol/L — ABNORMAL LOW (ref 98–111)
Creatinine, Ser: 0.75 mg/dL (ref 0.61–1.24)
GFR calc Af Amer: >60 mL/min (ref >60)
GFR calc non Af Amer: >60 mL/min (ref >60)
Glucose, Bld: 204 mg/dL — ABNORMAL HIGH (ref 70–99)
Potassium: 4.9 mmol/L (ref 3.5–5.1)
Sodium: 137 mmol/L (ref 135–145)
Total Bilirubin: 0.9 mg/dL (ref 0.3–1.2)
Total Protein: 7.3 g/dL (ref 6.5–8.1)

## 2020-03-20 LAB — LEGIONELLA PNEUMOPHILA SEROGP 1 UR AG: L. pneumophila Serogp 1 Ur Ag: NEGATIVE

## 2020-03-20 LAB — MAGNESIUM: Magnesium: 2.7 mg/dL — ABNORMAL HIGH (ref 1.7–2.4)

## 2020-03-20 LAB — PHOSPHORUS: Phosphorus: 2.9 mg/dL (ref 2.5–4.6)

## 2020-03-20 LAB — C-REACTIVE PROTEIN: CRP: 19.7 mg/dL — ABNORMAL HIGH (ref <1.0)

## 2020-03-20 LAB — PROCALCITONIN: Procalcitonin: 0.58 ng/mL

## 2020-03-20 LAB — FERRITIN: Ferritin: 3147 ng/mL — ABNORMAL HIGH (ref 24–336)

## 2020-03-20 LAB — MRSA PCR SCREENING: MRSA by PCR: POSITIVE — AB

## 2020-03-20 LAB — FIBRIN DERIVATIVES D-DIMER (ARMC ONLY): Fibrin derivatives D-dimer (ARMC): 1998.31 ng/mL (FEU) — ABNORMAL HIGH (ref 0.00–499.00)

## 2020-03-20 MED ORDER — ENOXAPARIN SODIUM 60 MG/0.6ML ~~LOC~~ SOLN
0.5000 mg/kg | SUBCUTANEOUS | Status: DC
  Administered 2020-03-20 – 2020-03-26 (×6): 47.5 mg via SUBCUTANEOUS
  Filled 2020-03-20 (×8): qty 0.6

## 2020-03-20 MED ORDER — PANTOPRAZOLE SODIUM 40 MG IV SOLR
40.0000 mg | Freq: Every day | INTRAVENOUS | Status: DC
  Administered 2020-03-20 – 2020-03-30 (×11): 40 mg via INTRAVENOUS
  Filled 2020-03-20 (×11): qty 40

## 2020-03-20 MED ORDER — TRAZODONE HCL 50 MG PO TABS
50.0000 mg | ORAL_TABLET | Freq: Every evening | ORAL | Status: DC | PRN
  Administered 2020-03-20 – 2020-03-22 (×2): 50 mg via ORAL
  Filled 2020-03-20 (×3): qty 1

## 2020-03-20 MED ORDER — ORAL CARE MOUTH RINSE
15.0000 mL | Freq: Two times a day (BID) | OROMUCOSAL | Status: DC
  Administered 2020-03-21 – 2020-03-22 (×3): 15 mL via OROMUCOSAL

## 2020-03-20 NOTE — Progress Notes (Signed)
PHARMACIST - PHYSICIAN COMMUNICATION  CONCERNING:  Enoxaparin (Lovenox) for DVT Prophylaxis    RECOMMENDATION: Patient was prescribed enoxaprin 40mg  q24 hours for VTE prophylaxis.   Filed Weights   04/11/20 2201 03/19/20 1809  Weight: 98 kg (216 lb) 92.8 kg (204 lb 9.4 oz)    Body mass index is 31.11 kg/m.  Estimated Creatinine Clearance: 131.7 mL/min (by C-G formula based on SCr of 0.75 mg/dL).   Based on [facility] policy patient is candidate for enoxaparin 0.5 mg/kg every 24 hours due to BMI > 30.   DESCRIPTION: Pharmacy has adjusted enoxaparin dose per [facility] policy.  Patient is now receiving enoxaparin 47.5 mg every 24 hours    [facility], PharmD Clinical Pharmacist  03/20/2020 1:58 PM

## 2020-03-20 NOTE — Progress Notes (Signed)
Pharmacy Electrolyte Monitoring Consult:  Pharmacy consulted to assist in monitoring and replacing electrolytes in this 44 y.o. male admitted on 03/26/2020 with Respiratory Distress   Labs:  Sodium (mmol/L)  Date Value  03/20/2020 137   Potassium (mmol/L)  Date Value  03/20/2020 4.9   Magnesium (mg/dL)  Date Value  60/67/7034 2.7 (H)   Phosphorus (mg/dL)  Date Value  03/52/4818 2.9   Calcium (mg/dL)  Date Value  59/03/3111 8.7 (L)   Albumin (g/dL)  Date Value  16/24/4695 3.1 (L)    Assessment/Plan: Goal: electrolytes WNL  Electrolytes WNL. No replacement indicated at this time. Continue to monitor.  Laureen Ochs, PharmD Clinical Pharmacist 03/20/2020 2:00 PM

## 2020-03-20 NOTE — Progress Notes (Signed)
CRITICAL CARE NOTE 8/23 admitted for COVID 19 pneumonia 8/25 remains on biPAP, high risk for intubation   CC  follow up respiratory failure   HPI Patient remains critically ill Prognosis is guarded On bIPAP Severe COVID pneumonia   BP 134/84   Pulse 96   Temp 97.8 F (36.6 C) (Oral)   Resp (!) 25   Ht 5\' 8"  (1.727 m)   Wt 92.8 kg   SpO2 98%   BMI 31.11 kg/m    I/O last 3 completed shifts: In: 450 [IV Piggyback:450] Out: 1675 [Urine:1675] No intake/output data recorded.  SpO2: 98 % O2 Flow Rate (L/min): 55 L/min FiO2 (%): 80 %  Estimated body mass index is 31.11 kg/m as calculated from the following:   Height as of this encounter: 5\' 8"  (1.727 m).   Weight as of this encounter: 92.8 kg.        COVID-19 DISASTER DECLARATION:   FULL CONTACT PHYSICAL EXAMINATION WAS NOT POSSIBLE DUE TO TREATMENT OF COVID-19  AND CONSERVATION OF PERSONAL PROTECTIVE EQUIPMENT, LIMITED EXAM FINDINGS INCLUDE-   PHYSICAL EXAMINATION:  GENERAL:critically ill appearing, +resp distress Alert and awake, follows commands NEUROLOGIC: no focal deficits, comfortable on biPAP but high risk for itnubation   Patient assessed or the symptoms described in the history of present illness.  In the context of the Global COVID-19 pandemic, which necessitated consideration that the patient might be at risk for infection with the SARS-CoV-2 virus that causes COVID-19, Institutional protocols and algorithms that pertain to the evaluation of patients at risk for COVID-19 are in a state of rapid change based on information released by regulatory bodies including the CDC and federal and state organizations. These policies and algorithms were followed during the patient's care while in hospital.    MEDICATIONS: I have reviewed all medications and confirmed regimen as documented   CULTURE RESULTS   Recent Results (from the past 240 hour(s))  SARS Coronavirus 2 by RT PCR (hospital order, performed  in [facility] hospital lab) Nasopharyngeal Nasopharyngeal Swab     Status: Abnormal   Collection Time: 03/10/20  4:56 PM   Specimen: Nasopharyngeal Swab  Result Value Ref Range Status   SARS Coronavirus 2 POSITIVE (A) NEGATIVE Final    Comment: RESULT CALLED TO, READ BACK BY AND VERIFIED WITH: ANGELA ROBBINS 03/10/20 AT 1819 BY AR (NOTE) SARS-CoV-2 target nucleic acids are DETECTED  SARS-CoV-2 RNA is generally detectable in upper respiratory specimens  during the acute phase of infection.  Positive results are indicative  of the presence of the identified virus, but do not rule out bacterial infection or co-infection with other pathogens not detected by the test.  Clinical correlation with patient history and  other diagnostic information is necessary to determine patient infection status.  The expected result is negative.  Fact Sheet for Patients:   03/12/20   Fact Sheet for Healthcare Providers:   03/12/20    This test is not yet approved or cleared by the [employer] FDA and  has been authorized for detection and/or diagnosis of SARS-CoV-2 by FDA under an Emergency Use Authorization (EUA).  This EUA will remain in effect (meaning this  test can be used) for the duration of  the COVID-19 declaration under Section 564(b)(1) of the Act, 21 U.S.C. section 360-bbb-3(b)(1), unless the authorization is terminated or revoked sooner.  Performed at [facility], [address] [place], [address] [place]   Blood Culture (routine x 2)     Status: None (Preliminary  result)   Collection Time: 2020-04-06 10:04 PM   Specimen: BLOOD  Result Value Ref Range Status   Specimen Description BLOOD RAC  Final   Special Requests BOTTLES DRAWN AEROBIC AND ANAEROBIC BCHV  Final   Culture   Final    NO GROWTH < 12 HOURS Performed at [facility], [address] [place], [place] [address]    Report Status  PENDING  Incomplete  Blood Culture (routine x 2)     Status: None (Preliminary result)   Collection Time: April 06, 2020 10:05 PM   Specimen: BLOOD  Result Value Ref Range Status   Specimen Description BLOOD RAC  Final   Special Requests BOTTLES DRAWN AEROBIC AND ANAEROBIC BCAV  Final   Culture   Final    NO GROWTH < 12 HOURS Performed at [facility], [address] [place], [place] [address]    Report Status PENDING  Incomplete  MRSA PCR Screening     Status: Abnormal   Collection Time: 03/19/20  5:13 AM   Specimen: Nasopharyngeal  Result Value Ref Range Status   MRSA by PCR POSITIVE (A) NEGATIVE Final    Comment:        The GeneXpert MRSA Assay (FDA approved for NASAL specimens only), is one component of a comprehensive MRSA colonization surveillance program. It is not intended to diagnose MRSA infection nor to guide or monitor treatment for MRSA infections. Cheral Marker @ 5625 03/19/20 RH Performed at [facility], [address] [place], [place] [address]         BMP Latest Ref Rng & Units 03/20/2020 03/19/2020 2020-04-06  Glucose 70 - 99 mg/dL 734(K) 876(O) 115(B)  BUN 6 - 20 mg/dL 26(O) 14 15  Creatinine 0.61 - 1.24 mg/dL 0.35 5.97 4.16  Sodium 135 - 145 mmol/L 137 134(L) 127(L)  Potassium 3.5 - 5.1 mmol/L 4.9 4.4 3.8  Chloride 98 - 111 mmol/L 97(L) 94(L) 89(L)  CO2 22 - 32 mmol/L 28 28 24   Calcium 8.9 - 10.3 mg/dL ) 8.3(L) 8.2(L)      ASSESSMENT AND PLAN SYNOPSIS  Acute hypoxemic respiratory failure due to COVID-19 pneumonia / ARDS biPAP as needed Wean to high flow [place] as tolerated Diuresis as blood pressure and renal function can tolerate diuresis as tolerated based on Kidney function Remdesivir +BARIC IV STEROIDS  Plan to repeat and check resp cultures    Severe ACUTE Hypoxic and Hypercapnic Respiratory Failure High risk for intubation  CARDIAC ICU monitoring   GI GI PROPHYLAXIS as indicated   DIET-->NPO Constipation  protocol as indicated  ENDO - will use ICU hypoglycemic\Hyperglycemia protocol if indicated     ELECTROLYTES -follow labs as needed -replace as needed -pharmacy consultation and following   DVT/GI PRX ordered and assessed TRANSFUSIONS AS NEEDED MONITOR FSBS I Assessed the need for Labs I Assessed the need for Foley I Assessed the need for Central Venous Line Family Discussion when available I Assessed the need for Mobilization I made an Assessment of medications to be adjusted accordingly Safety Risk assessment completed   CASE DISCUSSED IN MULTIDISCIPLINARY ROUNDS WITH ICU TEAM  Critical Care Time devoted to patient care services described in this note is 34 minutes.   Overall, patient is critically ill, prognosis is guarded.  Patient with Multiorgan failure and at high risk for cardiac arrest and death.    3.8(G, M.D.  [facility] Leather Pul[facility] Critical Care Medicine  Medical Director [facility] Medical Director [facility] Cardio-Pulmonary Department

## 2020-03-21 LAB — CBC WITH DIFFERENTIAL/PLATELET
Abs Immature Granulocytes: 0.56 10*3/uL — ABNORMAL HIGH (ref 0.00–0.07)
Basophils Absolute: 0 10*3/uL (ref 0.0–0.1)
Basophils Relative: 0 %
Eosinophils Absolute: 0 10*3/uL (ref 0.0–0.5)
Eosinophils Relative: 0 %
HCT: 41.2 % (ref 39.0–52.0)
Hemoglobin: 13.8 g/dL (ref 13.0–17.0)
Immature Granulocytes: 5 %
Lymphocytes Relative: 4 %
Lymphs Abs: 0.5 10*3/uL — ABNORMAL LOW (ref 0.7–4.0)
MCH: 30.2 pg (ref 26.0–34.0)
MCHC: 33.5 g/dL (ref 30.0–36.0)
MCV: 90.2 fL (ref 80.0–100.0)
Monocytes Absolute: 0.6 10*3/uL (ref 0.1–1.0)
Monocytes Relative: 6 %
Neutro Abs: 10 10*3/uL — ABNORMAL HIGH (ref 1.7–7.7)
Neutrophils Relative %: 85 %
Platelets: 464 10*3/uL — ABNORMAL HIGH (ref 150–400)
RBC: 4.57 MIL/uL (ref 4.22–5.81)
RDW: 13 % (ref 11.5–15.5)
WBC: 11.7 10*3/uL — ABNORMAL HIGH (ref 4.0–10.5)
nRBC: 0.8 % — ABNORMAL HIGH (ref 0.0–0.2)

## 2020-03-21 LAB — COMPREHENSIVE METABOLIC PANEL
ALT: 54 U/L — ABNORMAL HIGH (ref 0–44)
AST: 27 U/L (ref 15–41)
Albumin: 3 g/dL — ABNORMAL LOW (ref 3.5–5.0)
Alkaline Phosphatase: 80 U/L (ref 38–126)
Anion gap: 10 (ref 5–15)
BUN: 21 mg/dL — ABNORMAL HIGH (ref 6–20)
CO2: 27 mmol/L (ref 22–32)
Calcium: 8.5 mg/dL — ABNORMAL LOW (ref 8.9–10.3)
Chloride: 100 mmol/L (ref 98–111)
Creatinine, Ser: 0.71 mg/dL (ref 0.61–1.24)
GFR calc Af Amer: >60 mL/min (ref >60)
GFR calc non Af Amer: >60 mL/min (ref >60)
Glucose, Bld: 206 mg/dL — ABNORMAL HIGH (ref 70–99)
Potassium: 4.5 mmol/L (ref 3.5–5.1)
Sodium: 137 mmol/L (ref 135–145)
Total Bilirubin: 0.9 mg/dL (ref 0.3–1.2)
Total Protein: 6.8 g/dL (ref 6.5–8.1)

## 2020-03-21 LAB — FIBRIN DERIVATIVES D-DIMER (ARMC ONLY): Fibrin derivatives D-dimer (ARMC): 4878.12 ng/mL (FEU) — ABNORMAL HIGH (ref 0.00–499.00)

## 2020-03-21 LAB — FERRITIN: Ferritin: 2658 ng/mL — ABNORMAL HIGH (ref 24–336)

## 2020-03-21 LAB — MAGNESIUM: Magnesium: 2.3 mg/dL (ref 1.7–2.4)

## 2020-03-21 LAB — GLUCOSE, CAPILLARY: Glucose-Capillary: 150 mg/dL — ABNORMAL HIGH (ref 70–99)

## 2020-03-21 LAB — PHOSPHORUS: Phosphorus: 3.6 mg/dL (ref 2.5–4.6)

## 2020-03-21 LAB — C-REACTIVE PROTEIN: CRP: 7.2 mg/dL — ABNORMAL HIGH (ref <1.0)

## 2020-03-21 MED ORDER — FUROSEMIDE 10 MG/ML IJ SOLN
80.0000 mg | Freq: Once | INTRAMUSCULAR | Status: AC
  Administered 2020-03-21: 80 mg via INTRAVENOUS
  Filled 2020-03-21: qty 8

## 2020-03-21 MED ORDER — BLISTEX MEDICATED EX OINT
TOPICAL_OINTMENT | CUTANEOUS | Status: DC | PRN
  Filled 2020-03-21: qty 6.3

## 2020-03-21 NOTE — Progress Notes (Signed)
Pharmacy Electrolyte Monitoring Consult:  Pharmacy consulted to assist in monitoring and replacing electrolytes in this 44 y.o. male admitted on 03/07/2020 with Respiratory Distress   Labs:  Sodium (mmol/L)  Date Value  03/21/2020 137   Potassium (mmol/L)  Date Value  03/21/2020 4.5   Magnesium (mg/dL)  Date Value  84/13/2440 2.3   Phosphorus (mg/dL)  Date Value  05/23/2535 3.6   Calcium (mg/dL)  Date Value  64/40/3474 8.5 (L)   Albumin (g/dL)  Date Value  25/95/6387 3.0 (L)    Assessment/Plan: Goal: electrolytes WNL  Electrolytes WNL. No replacement indicated at this time. Patient with good PO intake. Continue to monitor.  Laureen Ochs, PharmD Clinical Pharmacist 03/21/2020 3:13 PM

## 2020-03-21 NOTE — Progress Notes (Signed)
CRITICAL CARE NOTE 8/23 admitted for COVID 19 pneumonia 8/25 remains on biPAP, high risk for intubation 8/26 remains hypoxic, high risk for intubation  CC  follow up respiratory failure  SUBJECTIVE Patient remains critically ill Prognosis is guarded On high flow [place] and NRB mask High risk for intubation/cardiac arrest    BP (!) 132/93   Pulse (!) 108   Temp 98.1 F (36.7 C) (Axillary)   Resp (!) 23   Ht 5\' 8"  (1.727 m)   Wt 93.2 kg   SpO2 93%   BMI 31.24 kg/m    I/O last 3 completed shifts: In: 1480 [P.O.:480; IV Piggyback:1000] Out: 2350 [Urine:2350] No intake/output data recorded.  SpO2: 93 % O2 Flow Rate (L/min): 50 L/min FiO2 (%): 95 %  Estimated body mass index is 31.24 kg/m as calculated from the following:   Height as of this encounter: 5\' 8"  (1.727 m).   Weight as of this encounter: 93.2 kg.    REVIEW OF SYSTEMS  PATIENT IS UNABLE TO PROVIDE COMPLETE REVIEW OF SYSTEMS DUE TO SEVERE CRITICAL ILLNESS      COVID-19 DISASTER DECLARATION:   FULL CONTACT PHYSICAL EXAMINATION WAS NOT POSSIBLE DUE TO TREATMENT OF COVID-19  AND CONSERVATION OF PERSONAL PROTECTIVE EQUIPMENT, LIMITED EXAM FINDINGS INCLUDE-   PHYSICAL EXAMINATION:  GENERAL:critically ill appearing, +resp distress NEUROLOGIC: lethargic +rhonchi No edema    Patient assessed or the symptoms described in the history of present illness.  In the context of the Global COVID-19 pandemic, which necessitated consideration that the patient might be at risk for infection with the SARS-CoV-2 virus that causes COVID-19, Institutional protocols and algorithms that pertain to the evaluation of patients at risk for COVID-19 are in a state of rapid change based on information released by regulatory bodies including the CDC and federal and state organizations. These policies and algorithms were followed during the patient's care while in hospital.    MEDICATIONS: I have reviewed all medications and  confirmed regimen as documented   CULTURE RESULTS   Recent Results (from the past 240 hour(s))  Blood Culture (routine x 2)     Status: None (Preliminary result)   Collection Time: 03-21-20 10:04 PM   Specimen: BLOOD  Result Value Ref Range Status   Specimen Description BLOOD RAC  Final   Special Requests BOTTLES DRAWN AEROBIC AND ANAEROBIC BCHV  Final   Culture   Final    NO GROWTH 3 DAYS Performed at [facility], [address] [place], [address] [place]    Report Status PENDING  Incomplete  Blood Culture (routine x 2)     Status: None (Preliminary result)   Collection Time: 03/21/2020 10:05 PM   Specimen: BLOOD  Result Value Ref Range Status   Specimen Description BLOOD RAC  Final   Special Requests BOTTLES DRAWN AEROBIC AND ANAEROBIC BCAV  Final   Culture   Final    NO GROWTH 3 DAYS Performed at [facility], [address] [place], [address] [place]    Report Status PENDING  Incomplete  MRSA PCR Screening     Status: Abnormal   Collection Time: 03/19/20  5:13 AM   Specimen: Nasopharyngeal  Result Value Ref Range Status   MRSA by PCR POSITIVE (A) NEGATIVE Corrected    Comment:        The GeneXpert MRSA Assay (FDA approved for NASAL specimens only), is one component of a comprehensive MRSA colonization surveillance program. It is not intended to diagnose MRSA infection nor to  guide or monitor treatment for MRSA infections. RESULT CALLED TO, READ BACK BY AND VERIFIED WITH: Wynonia Sours RN AT 0715 ON 03/19/20 RH Performed at [facility], [address] [place], [place] [address] CORRECTED ON 08/25 AT 0757: PREVIOUSLY REPORTED AS POSITIVE        The GeneXpert MRSA Assay (FDA approved for NASAL specimens only), is one component of a comprehensive MRSA colonization surveillance program. It is not intended to diagnose MRSA infection  nor to guide or monitor treatment for MRSA infections. C/ ANNIE SMITH @ 3299 03/19/20 RH         BMP  Latest Ref Rng & Units 03/20/2020 03/19/2020 2020-04-03  Glucose 70 - 99 mg/dL 242(A) 834(H) 962(I)  BUN 6 - 20 mg/dL 29(N) 14 15  Creatinine 0.61 - 1.24 mg/dL 9.89 2.11 9.41  Sodium 135 - 145 mmol/L 137 134(L) 127(L)  Potassium 3.5 - 5.1 mmol/L 4.9 4.4 3.8  Chloride 98 - 111 mmol/L 97(L) 94(L) 89(L)  CO2 22 - 32 mmol/L 28 28 24   Calcium 8.9 - 10.3 mg/dL ) 8.3(L) 8.2(L)          Indwelling Urinary Catheter continued, requirement due to   Reason to continue Indwelling Urinary Catheter strict Intake/Output monitoring for hemodynamic instability      ASSESSMENT AND PLAN SYNOPSIS  Acute hypoxemic respiratory failure due to COVID-19 pneumonia / ARDS High risk for intubation/cardiac arrest/death Diuresis as blood pressure and renal function can tolerate diuresis as tolerated based on Kidney function Remdesivir +BARIC IV STEROIDS  Follow inflammatory markers: Ferritin, D-dimer, CRP, IL-6, LDH Vitamin C, zinc Plan to repeat and check resp cultures    Severe ACUTE Hypoxic and Hypercapnic Respiratory Failure Anticipate intubation soon  CARDIAC ICU monitoring   GI GI PROPHYLAXIS as indicated   DIET--> as tolerated Constipation protocol as indicated  ENDO - will use ICU hypoglycemic\Hyperglycemia protocol if indicated     ELECTROLYTES -follow labs as needed -replace as needed -pharmacy consultation and following   DVT/GI PRX ordered and assessed TRANSFUSIONS AS NEEDED MONITOR FSBS I Assessed the need for Labs I Assessed the need for Foley I Assessed the need for Central Venous Line Family Discussion when available I Assessed the need for Mobilization I made an Assessment of medications to be adjusted accordingly Safety Risk assessment completed   CASE DISCUSSED IN MULTIDISCIPLINARY ROUNDS WITH ICU TEAM  Critical Care Time devoted to patient care services described in this note is 34 minutes.   Overall, patient is critically ill, prognosis is  guarded.    7.4(Y, M.D.  [facility]ather [facility] Critical Care Medicine  Medical Director [facility] Medical Director [facility] Cardio-Pulmonary Department

## 2020-03-22 LAB — COMPREHENSIVE METABOLIC PANEL
ALT: 77 U/L — ABNORMAL HIGH (ref 0–44)
AST: 48 U/L — ABNORMAL HIGH (ref 15–41)
Albumin: 3.2 g/dL — ABNORMAL LOW (ref 3.5–5.0)
Alkaline Phosphatase: 84 U/L (ref 38–126)
Anion gap: 13 (ref 5–15)
BUN: 24 mg/dL — ABNORMAL HIGH (ref 6–20)
CO2: 26 mmol/L (ref 22–32)
Calcium: 8.7 mg/dL — ABNORMAL LOW (ref 8.9–10.3)
Chloride: 93 mmol/L — ABNORMAL LOW (ref 98–111)
Creatinine, Ser: 0.79 mg/dL (ref 0.61–1.24)
GFR calc Af Amer: >60 mL/min (ref >60)
GFR calc non Af Amer: >60 mL/min (ref >60)
Glucose, Bld: 209 mg/dL — ABNORMAL HIGH (ref 70–99)
Potassium: 4.8 mmol/L (ref 3.5–5.1)
Sodium: 132 mmol/L — ABNORMAL LOW (ref 135–145)
Total Bilirubin: 1 mg/dL (ref 0.3–1.2)
Total Protein: 7.1 g/dL (ref 6.5–8.1)

## 2020-03-22 LAB — CBC WITH DIFFERENTIAL/PLATELET
Abs Immature Granulocytes: 1.09 10*3/uL — ABNORMAL HIGH (ref 0.00–0.07)
Basophils Absolute: 0.1 10*3/uL (ref 0.0–0.1)
Basophils Relative: 1 %
Eosinophils Absolute: 0 10*3/uL (ref 0.0–0.5)
Eosinophils Relative: 0 %
HCT: 45.6 % (ref 39.0–52.0)
Hemoglobin: 15.6 g/dL (ref 13.0–17.0)
Immature Granulocytes: 7 %
Lymphocytes Relative: 3 %
Lymphs Abs: 0.5 10*3/uL — ABNORMAL LOW (ref 0.7–4.0)
MCH: 30.8 pg (ref 26.0–34.0)
MCHC: 34.2 g/dL (ref 30.0–36.0)
MCV: 89.9 fL (ref 80.0–100.0)
Monocytes Absolute: 1 10*3/uL (ref 0.1–1.0)
Monocytes Relative: 6 %
Neutro Abs: 13.5 10*3/uL — ABNORMAL HIGH (ref 1.7–7.7)
Neutrophils Relative %: 83 %
Platelets: 545 10*3/uL — ABNORMAL HIGH (ref 150–400)
RBC: 5.07 MIL/uL (ref 4.22–5.81)
RDW: 12.8 % (ref 11.5–15.5)
Smear Review: NORMAL
WBC: 16.2 10*3/uL — ABNORMAL HIGH (ref 4.0–10.5)
nRBC: 0.6 % — ABNORMAL HIGH (ref 0.0–0.2)

## 2020-03-22 LAB — C-REACTIVE PROTEIN: CRP: 5.4 mg/dL — ABNORMAL HIGH (ref <1.0)

## 2020-03-22 LAB — FERRITIN: Ferritin: 2260 ng/mL — ABNORMAL HIGH (ref 24–336)

## 2020-03-22 LAB — FIBRIN DERIVATIVES D-DIMER (ARMC ONLY): Fibrin derivatives D-dimer (ARMC): 3168.01 ng/mL (FEU) — ABNORMAL HIGH (ref 0.00–499.00)

## 2020-03-22 LAB — HEMOGLOBIN A1C
Hgb A1c MFr Bld: 8.1 % — ABNORMAL HIGH (ref 4.8–5.6)
Mean Plasma Glucose: 185.77 mg/dL

## 2020-03-22 LAB — GLUCOSE, CAPILLARY
Glucose-Capillary: 193 mg/dL — ABNORMAL HIGH (ref 70–99)
Glucose-Capillary: 254 mg/dL — ABNORMAL HIGH (ref 70–99)
Glucose-Capillary: 127 mg/dL — ABNORMAL HIGH (ref 70–99)
Glucose-Capillary: 154 mg/dL — ABNORMAL HIGH (ref 70–99)

## 2020-03-22 LAB — PHOSPHORUS: Phosphorus: 4.3 mg/dL (ref 2.5–4.6)

## 2020-03-22 LAB — MAGNESIUM: Magnesium: 2.3 mg/dL (ref 1.7–2.4)

## 2020-03-22 MED ORDER — INSULIN ASPART 100 UNIT/ML ~~LOC~~ SOLN
0.0000 [IU] | SUBCUTANEOUS | Status: DC
  Administered 2020-03-22: 3 [IU] via SUBCUTANEOUS
  Administered 2020-03-22: 2 [IU] via SUBCUTANEOUS
  Administered 2020-03-22: 8 [IU] via SUBCUTANEOUS
  Administered 2020-03-22 – 2020-03-23 (×3): 3 [IU] via SUBCUTANEOUS
  Administered 2020-03-23: 5 [IU] via SUBCUTANEOUS
  Administered 2020-03-23 – 2020-03-24 (×5): 3 [IU] via SUBCUTANEOUS
  Administered 2020-03-24: 2 [IU] via SUBCUTANEOUS
  Administered 2020-03-24 (×2): 5 [IU] via SUBCUTANEOUS
  Administered 2020-03-25: 3 [IU] via SUBCUTANEOUS
  Administered 2020-03-25: 2 [IU] via SUBCUTANEOUS
  Administered 2020-03-25: 3 [IU] via SUBCUTANEOUS
  Administered 2020-03-25 – 2020-03-26 (×3): 2 [IU] via SUBCUTANEOUS
  Administered 2020-03-26: 3 [IU] via SUBCUTANEOUS
  Administered 2020-03-26: 2 [IU] via SUBCUTANEOUS
  Administered 2020-03-26 – 2020-03-27 (×6): 3 [IU] via SUBCUTANEOUS
  Administered 2020-03-27: 2 [IU] via SUBCUTANEOUS
  Administered 2020-03-27: 3 [IU] via SUBCUTANEOUS
  Administered 2020-03-28: 5 [IU] via SUBCUTANEOUS
  Administered 2020-03-28 (×3): 3 [IU] via SUBCUTANEOUS
  Administered 2020-03-28 (×2): 5 [IU] via SUBCUTANEOUS
  Administered 2020-03-29 (×3): 3 [IU] via SUBCUTANEOUS
  Administered 2020-03-29: 5 [IU] via SUBCUTANEOUS
  Administered 2020-03-29: 2 [IU] via SUBCUTANEOUS
  Administered 2020-03-29: 5 [IU] via SUBCUTANEOUS
  Administered 2020-03-30: 2 [IU] via SUBCUTANEOUS
  Administered 2020-03-30: 3 [IU] via SUBCUTANEOUS
  Administered 2020-03-30: 2 [IU] via SUBCUTANEOUS
  Filled 2020-03-22 (×46): qty 1

## 2020-03-22 MED ORDER — ALPRAZOLAM 0.25 MG PO TABS
0.2500 mg | ORAL_TABLET | Freq: Every evening | ORAL | Status: DC | PRN
  Administered 2020-03-22 (×2): 0.25 mg via ORAL
  Filled 2020-03-22 (×2): qty 1

## 2020-03-22 MED ORDER — DEXMEDETOMIDINE HCL IN NACL 400 MCG/100ML IV SOLN
0.1000 ug/kg/h | INTRAVENOUS | Status: DC
  Administered 2020-03-22: 0.4 ug/kg/h via INTRAVENOUS
  Administered 2020-03-23 (×5): 1.2 ug/kg/h via INTRAVENOUS
  Administered 2020-03-23: 1 ug/kg/h via INTRAVENOUS
  Administered 2020-03-24 (×6): 1.2 ug/kg/h via INTRAVENOUS
  Administered 2020-03-25: 1 ug/kg/h via INTRAVENOUS
  Administered 2020-03-25 (×2): 1.2 ug/kg/h via INTRAVENOUS
  Administered 2020-03-25 – 2020-03-26 (×3): 1 ug/kg/h via INTRAVENOUS
  Administered 2020-03-26: 1.2 ug/kg/h via INTRAVENOUS
  Administered 2020-03-26: 1 ug/kg/h via INTRAVENOUS
  Administered 2020-03-26: 1.2 ug/kg/h via INTRAVENOUS
  Administered 2020-03-26 – 2020-03-27 (×5): 1 ug/kg/h via INTRAVENOUS
  Administered 2020-03-28 (×2): 1.2 ug/kg/h via INTRAVENOUS
  Administered 2020-03-28 (×3): 1 ug/kg/h via INTRAVENOUS
  Administered 2020-03-29: 1.2 ug/kg/h via INTRAVENOUS
  Administered 2020-03-29: 0.6 ug/kg/h via INTRAVENOUS
  Administered 2020-03-29: 1 ug/kg/h via INTRAVENOUS
  Filled 2020-03-22 (×41): qty 100

## 2020-03-22 NOTE — Progress Notes (Addendum)
Patient continues to be dependent on HFNC with NRB. Patient is extremely restless and agitated, consistently gets out of bed without assistance, removed monitors and lines, and told nursing staff with use of interpreter " I would rather sign out AMA than be intubated." Patient is non-compliant with breathing techniques and positioning. Refused to prone and consistently laying flat on back despite feeling SOB and desatting. Agreeable to precedex gtt, PRN xanax and trazodone but continue to be restless and agitated. Discussed with NP. Remains A&OX4.

## 2020-03-22 NOTE — Progress Notes (Signed)
CRITICAL CARE NOTE 8/23 admitted for COVID 19 pneumonia 8/25 remains on biPAP, high risk for intubation 8/26 remains hypoxic, high risk for intubation  CC  follow up respiratory failure  SUBJECTIVE Remains on high flow nasal cannula, guarded prognosis   BP (!) 133/98   Pulse 98   Temp 98.1 F (36.7 C)   Resp (!) 22   Ht 5\' 8"  (1.727 m)   Wt 93.2 kg   SpO2 97%   BMI 31.24 kg/m    I/O last 3 completed shifts: In: 1170 [P.O.:720; IV Piggyback:450] Out: 3850 [Urine:3850] Total I/O In: -  Out: 675 [Urine:675]  SpO2: 97 % O2 Flow Rate (L/min): 50 L/min FiO2 (%): 83 %  Estimated body mass index is 31.24 kg/m as calculated from the following:   Height as of this encounter: 5\' 8"  (1.727 m).   Weight as of this encounter: 93.2 kg.    REVIEW OF SYSTEMS  PATIENT IS UNABLE TO PROVIDE COMPLETE REVIEW OF SYSTEMS DUE TO SEVERE CRITICAL ILLNESS    PHYSICAL EXAMINATION: Blood pressure 125/84, pulse (!) 123, temperature 97.8 F (36.6 C), resp. rate (!) 25, height 5\' 8"  (1.727 m), weight 93.2 kg, SpO2 92 %. Gen:      No acute distress HEENT:  EOMI, sclera anicteric Neck:     No masses; no thyromegaly Lungs:    Scattered crackles CV:         Regular rate and rhythm; no murmurs Abd:      + bowel sounds; soft, non-tender; no palpable masses, no distension Ext:    No edema; adequate peripheral perfusion Skin:      Warm and dry; no rash Neuro: alert and oriented x 3 Psych: normal mood and affect  Labs/imaging reviewed Significant for  Glucose 193, BUN/creatinine 24/0.79, WBC 16.2 No new imaging   Patient assessed or the symptoms described in the history of present illness.  In the context of the Global COVID-19 pandemic, which necessitated consideration that the patient might be at risk for infection with the SARS-CoV-2 virus that causes COVID-19, Institutional protocols and algorithms that pertain to the evaluation of patients at risk for COVID-19 are in a state of rapid  change based on information released by regulatory bodies including the CDC and federal and state organizations. These policies and algorithms were followed during the patient's care while in hospital.    MEDICATIONS: I have reviewed all medications and confirmed regimen as documented   CULTURE RESULTS   Recent Results (from the past 240 hour(s))  Blood Culture (routine x 2)     Status: None (Preliminary result)   Collection Time: 05-Apr-2020 10:04 PM   Specimen: BLOOD  Result Value Ref Range Status   Specimen Description BLOOD RAC  Final   Special Requests BOTTLES DRAWN AEROBIC AND ANAEROBIC BCHV  Final   Culture   Final    NO GROWTH 4 DAYS Performed at [facility], [address], [place], [facility] [address]    Report Status PENDING  Incomplete  Blood Culture (routine x 2)     Status: None (Preliminary result)   Collection Time: 04/05/20 10:05 PM   Specimen: BLOOD  Result Value Ref Range Status   Specimen Description BLOOD RAC  Final   Special Requests BOTTLES DRAWN AEROBIC AND ANAEROBIC BCAV  Final   Culture   Final    NO GROWTH 4 DAYS Performed at [facility], [address], [place], [facility] [address]    Report Status PENDING  Incomplete  MRSA PCR Screening     Status: Abnormal   Collection Time: 03/19/20  5:13 AM   Specimen: Nasopharyngeal  Result Value Ref Range Status   MRSA by PCR POSITIVE (A) NEGATIVE Corrected    Comment:        The GeneXpert MRSA Assay (FDA approved for NASAL specimens only), is one component of a comprehensive MRSA colonization surveillance program. It is not intended to diagnose MRSA infection nor to guide or monitor treatment for MRSA infections. RESULT CALLED TO, READ BACK BY AND VERIFIED WITH: Wynonia Sours RN AT 0715 ON 03/19/20 RH Performed at [facility], [address] [place], [place] [address] CORRECTED ON 08/25 AT 0757: PREVIOUSLY REPORTED AS POSITIVE        The GeneXpert MRSA Assay (FDA approved for  NASAL specimens only), is one component of a comprehensive MRSA colonization surveillance program. It is not intended to diagnose MRSA infection  nor to guide or monitor treatment for MRSA infections. C/ ANNIE SMITH @ 2878 03/19/20 RH         BMP Latest Ref Rng & Units 03/22/2020 03/21/2020 03/20/2020  Glucose 70 - 99 mg/dL 676(H) 209(O) 709(G)  BUN 6 - 20 mg/dL 28(Z) 66(Q) 94(T)  Creatinine 0.61 - 1.24 mg/dL 6.54 6.50 3.54  Sodium 135 - 145 mmol/L 132(L) 137 137  Potassium 3.5 - 5.1 mmol/L 4.8 4.5 4.9  Chloride 98 - 111 mmol/L 93(L) 100 97(L)  CO2 22 - 32 mmol/L 26 27 28   Calcium 8.9 - 10.3 mg/dL ) 6.5(K) 8.1(E)          Indwelling Urinary Catheter continued, requirement due to   Reason to continue Indwelling Urinary Catheter strict Intake/Output monitoring for hemodynamic instability      ASSESSMENT AND PLAN SYNOPSIS Acute hypoxic respiratory failure secondary to COVID-19 pneumonia, ARDS Tenuous respiratory status but no imminent need for intubation Continue self proning as tolerated Continue remdesivir, baracitnib, steroids Intermittent chest x-ray  Hyperglycemia Start SSI coverage  Wife updated 8/27 over telephone with spanish interpreter.     ELECTROLYTES -follow labs as needed -replace as needed -pharmacy consultation and following   DVT/GI PRX ordered and assessed-on Lovenox TRANSFUSIONS AS NEEDED MONITOR FSBS I Assessed the need for Labs I Assessed the need for Foley I Assessed the need for Central Venous Line Family Discussion when available I Assessed the need for Mobilization I made an Assessment of medications to be adjusted accordingly Safety Risk assessment completed   CASE DISCUSSED IN MULTIDISCIPLINARY ROUNDS WITH ICU TEAM  The patient is critically ill with multiple organ system failure and requires high complexity decision making for assessment and support, frequent evaluation and titration of therapies, advanced monitoring, review of  radiographic studies and interpretation of complex data.   Critical Care Time devoted to patient care services, exclusive of separately billable procedures, described in this note is 35 minutes.   9/27 MD [facility] Please see [email or web address] for pager details.  03/22/2020, 9:15 AM

## 2020-03-22 NOTE — Progress Notes (Signed)
Right AC IV non functional. When removed patient  yelled excessively. # 20 placed in left forearm.

## 2020-03-22 NOTE — Progress Notes (Signed)
Pharmacy Electrolyte Monitoring Consult:  Pharmacy consulted to assist in monitoring and replacing electrolytes in this 44 y.o. male admitted on 03/17/2020 with Respiratory Distress   Labs:  Sodium (mmol/L)  Date Value  03/22/2020 132 (L)   Potassium (mmol/L)  Date Value  03/22/2020 4.8   Magnesium (mg/dL)  Date Value  61/44/3154 2.3   Phosphorus (mg/dL)  Date Value  00/86/7619 4.3   Calcium (mg/dL)  Date Value  50/93/2671 8.7 (L)   Albumin (g/dL)  Date Value  24/58/0998 3.2 (L)    Assessment/Plan: Goal: electrolytes WNL  Electrolytes WNL. No replacement indicated at this time. Patient with good PO intake. Continue to monitor.  Laureen Ochs, PharmD Clinical Pharmacist 03/22/2020 1:41 PM

## 2020-03-23 ENCOUNTER — Inpatient Hospital Stay: Payer: HRSA Program

## 2020-03-23 LAB — CULTURE, BLOOD (ROUTINE X 2)
Culture: NO GROWTH
Culture: NO GROWTH

## 2020-03-23 LAB — CBC WITH DIFFERENTIAL/PLATELET
Abs Immature Granulocytes: 1.28 10*3/uL — ABNORMAL HIGH (ref 0.00–0.07)
Basophils Absolute: 0.1 10*3/uL (ref 0.0–0.1)
Basophils Relative: 1 %
Eosinophils Absolute: 0.1 10*3/uL (ref 0.0–0.5)
Eosinophils Relative: 1 %
HCT: 42 % (ref 39.0–52.0)
Hemoglobin: 15.1 g/dL (ref 13.0–17.0)
Immature Granulocytes: 6 %
Lymphocytes Relative: 3 %
Lymphs Abs: 0.6 10*3/uL — ABNORMAL LOW (ref 0.7–4.0)
MCH: 31 pg (ref 26.0–34.0)
MCHC: 36 g/dL (ref 30.0–36.0)
MCV: 86.2 fL (ref 80.0–100.0)
Monocytes Absolute: 0.5 10*3/uL (ref 0.1–1.0)
Monocytes Relative: 2 %
Neutro Abs: 20.4 10*3/uL — ABNORMAL HIGH (ref 1.7–7.7)
Neutrophils Relative %: 87 %
Platelets: 433 10*3/uL — ABNORMAL HIGH (ref 150–400)
RBC: 4.87 MIL/uL (ref 4.22–5.81)
RDW: 12.5 % (ref 11.5–15.5)
WBC: 23 10*3/uL — ABNORMAL HIGH (ref 4.0–10.5)
nRBC: 0.1 % (ref 0.0–0.2)

## 2020-03-23 LAB — BLOOD GAS, ARTERIAL
Acid-Base Excess: 3.8 mmol/L — ABNORMAL HIGH (ref 0.0–2.0)
Bicarbonate: 25.9 mmol/L (ref 20.0–28.0)
FIO2: 100
O2 Saturation: 92.1 %
Patient temperature: 37
pCO2 arterial: 31 mmHg — ABNORMAL LOW (ref 32.0–48.0)
pH, Arterial: 7.53 — ABNORMAL HIGH (ref 7.350–7.450)
pO2, Arterial: 56 mmHg — ABNORMAL LOW (ref 83.0–108.0)

## 2020-03-23 LAB — COMPREHENSIVE METABOLIC PANEL
ALT: 47 U/L — ABNORMAL HIGH (ref 0–44)
AST: 31 U/L (ref 15–41)
Albumin: 2.8 g/dL — ABNORMAL LOW (ref 3.5–5.0)
Alkaline Phosphatase: 78 U/L (ref 38–126)
Anion gap: 6 (ref 5–15)
BUN: 19 mg/dL (ref 6–20)
CO2: 29 mmol/L (ref 22–32)
Calcium: 8.1 mg/dL — ABNORMAL LOW (ref 8.9–10.3)
Chloride: 95 mmol/L — ABNORMAL LOW (ref 98–111)
Creatinine, Ser: 0.71 mg/dL (ref 0.61–1.24)
GFR calc Af Amer: >60 mL/min (ref >60)
GFR calc non Af Amer: >60 mL/min (ref >60)
Glucose, Bld: 174 mg/dL — ABNORMAL HIGH (ref 70–99)
Potassium: 4.5 mmol/L (ref 3.5–5.1)
Sodium: 130 mmol/L — ABNORMAL LOW (ref 135–145)
Total Bilirubin: 1.1 mg/dL (ref 0.3–1.2)
Total Protein: 6.4 g/dL — ABNORMAL LOW (ref 6.5–8.1)

## 2020-03-23 LAB — PHOSPHORUS: Phosphorus: 2.7 mg/dL (ref 2.5–4.6)

## 2020-03-23 LAB — C-REACTIVE PROTEIN: CRP: 14.4 mg/dL — ABNORMAL HIGH (ref <1.0)

## 2020-03-23 LAB — GLUCOSE, CAPILLARY
Glucose-Capillary: 154 mg/dL — ABNORMAL HIGH (ref 70–99)
Glucose-Capillary: 163 mg/dL — ABNORMAL HIGH (ref 70–99)
Glucose-Capillary: 164 mg/dL — ABNORMAL HIGH (ref 70–99)
Glucose-Capillary: 164 mg/dL — ABNORMAL HIGH (ref 70–99)
Glucose-Capillary: 171 mg/dL — ABNORMAL HIGH (ref 70–99)
Glucose-Capillary: 197 mg/dL — ABNORMAL HIGH (ref 70–99)
Glucose-Capillary: 234 mg/dL — ABNORMAL HIGH (ref 70–99)

## 2020-03-23 LAB — TRIGLYCERIDES: Triglycerides: 149 mg/dL (ref <150)

## 2020-03-23 LAB — FIBRIN DERIVATIVES D-DIMER (ARMC ONLY): Fibrin derivatives D-dimer (ARMC): 6127.82 ng/mL (FEU) — ABNORMAL HIGH (ref 0.00–499.00)

## 2020-03-23 LAB — FERRITIN: Ferritin: 1760 ng/mL — ABNORMAL HIGH (ref 24–336)

## 2020-03-23 LAB — MAGNESIUM: Magnesium: 2.1 mg/dL (ref 1.7–2.4)

## 2020-03-23 MED ORDER — FENTANYL CITRATE (PF) 100 MCG/2ML IJ SOLN
INTRAMUSCULAR | Status: AC
Start: 2020-03-23 — End: 2020-03-23
  Administered 2020-03-23: 100 ug
  Filled 2020-03-23: qty 2

## 2020-03-23 MED ORDER — NOREPINEPHRINE 4 MG/250ML-% IV SOLN
0.0000 ug/min | INTRAVENOUS | Status: DC
  Administered 2020-03-23: 5 ug/min via INTRAVENOUS
  Administered 2020-03-25: 2 ug/min via INTRAVENOUS
  Filled 2020-03-23 (×2): qty 250

## 2020-03-23 MED ORDER — VECURONIUM BROMIDE 10 MG IV SOLR
INTRAVENOUS | Status: AC
  Administered 2020-03-23: 10 mg
  Filled 2020-03-23: qty 10

## 2020-03-23 MED ORDER — FENTANYL 2500MCG IN NS 250ML (10MCG/ML) PREMIX INFUSION
0.0000 ug/h | INTRAVENOUS | Status: DC
  Administered 2020-03-23: 400 ug/h via INTRAVENOUS
  Administered 2020-03-23: 250 ug/h via INTRAVENOUS
  Administered 2020-03-24 – 2020-03-26 (×5): 350 ug/h via INTRAVENOUS
  Filled 2020-03-23 (×10): qty 250

## 2020-03-23 MED ORDER — MIDAZOLAM HCL 2 MG/2ML IJ SOLN: 2.0000 mg | Freq: Once | INTRAMUSCULAR | Status: AC

## 2020-03-23 MED ORDER — MIDAZOLAM HCL 2 MG/2ML IJ SOLN
INTRAMUSCULAR | Status: AC
  Administered 2020-03-23: 2 mg via INTRAVENOUS
  Filled 2020-03-23: qty 2

## 2020-03-23 MED ORDER — ORAL CARE MOUTH RINSE
15.0000 mL | OROMUCOSAL | Status: DC
  Administered 2020-03-23 – 2020-03-30 (×72): 15 mL via OROMUCOSAL

## 2020-03-23 MED ORDER — VECURONIUM BROMIDE 10 MG IV SOLR
INTRAVENOUS | Status: AC
  Filled 2020-03-23: qty 10

## 2020-03-23 MED ORDER — PROPOFOL 1000 MG/100ML IV EMUL
5.0000 ug/kg/min | INTRAVENOUS | Status: DC
  Administered 2020-03-23 (×2): 60 ug/kg/min via INTRAVENOUS
  Administered 2020-03-23 – 2020-03-24 (×7): 40 ug/kg/min via INTRAVENOUS
  Administered 2020-03-25 (×2): 45 ug/kg/min via INTRAVENOUS
  Administered 2020-03-25: 35.765 ug/kg/min via INTRAVENOUS
  Administered 2020-03-25 (×2): 45 ug/kg/min via INTRAVENOUS
  Administered 2020-03-26: 50 ug/kg/min via INTRAVENOUS
  Administered 2020-03-26: 60 ug/kg/min via INTRAVENOUS
  Administered 2020-03-26: 50 ug/kg/min via INTRAVENOUS
  Administered 2020-03-26: 70 ug/kg/min via INTRAVENOUS
  Administered 2020-03-26: 60 ug/kg/min via INTRAVENOUS
  Administered 2020-03-26 (×2): 55 ug/kg/min via INTRAVENOUS
  Administered 2020-03-27 (×2): 50 ug/kg/min via INTRAVENOUS
  Administered 2020-03-27 (×3): 55 ug/kg/min via INTRAVENOUS
  Administered 2020-03-27 – 2020-03-29 (×7): 50 ug/kg/min via INTRAVENOUS
  Filled 2020-03-23 (×39): qty 100

## 2020-03-23 MED ORDER — MIDODRINE HCL 5 MG PO TABS
5.0000 mg | ORAL_TABLET | Freq: Three times a day (TID) | ORAL | Status: DC
  Administered 2020-03-23 – 2020-03-29 (×17): 5 mg via ORAL
  Filled 2020-03-23 (×17): qty 1

## 2020-03-23 MED ORDER — PROPOFOL 1000 MG/100ML IV EMUL
INTRAVENOUS | Status: AC
  Administered 2020-03-23: 60 ug/kg/min via INTRAVENOUS
  Filled 2020-03-23: qty 100

## 2020-03-23 MED ORDER — CHLORHEXIDINE GLUCONATE 0.12% ORAL RINSE (MEDLINE KIT)
15.0000 mL | Freq: Two times a day (BID) | OROMUCOSAL | Status: DC
  Administered 2020-03-23 – 2020-03-30 (×15): 15 mL via OROMUCOSAL

## 2020-03-23 MED ORDER — FENTANYL 2500MCG IN NS 250ML (10MCG/ML) PREMIX INFUSION
INTRAVENOUS | Status: AC
  Administered 2020-03-23: 400 ug/h via INTRAVENOUS
  Filled 2020-03-23: qty 250

## 2020-03-23 NOTE — Progress Notes (Signed)
Pharmacy Electrolyte Monitoring Consult:  Pharmacy consulted to assist in monitoring and replacing electrolytes in this 44 y.o. male admitted on 03/08/2020 with Respiratory Distress   Labs:  Sodium (mmol/L)  Date Value  03/23/2020 130 (L)   Potassium (mmol/L)  Date Value  03/23/2020 4.5   Magnesium (mg/dL)  Date Value  36/06/2448 2.1   Phosphorus (mg/dL)  Date Value  75/30/0511 2.7   Calcium (mg/dL)  Date Value  09/06/1733 8.1 (L)   Albumin (g/dL)  Date Value  67/07/4101 2.8 (L)    Assessment/Plan: Goal: electrolytes WNL  Electrolytes WNL. No replacement indicated at this time. Patient with good PO intake - possible intubation. Continue to monitor.  Paschal Dopp, PharmD, BCPS Clinical Pharmacist 03/23/2020 8:51 AM

## 2020-03-23 NOTE — Progress Notes (Signed)
CRITICAL CARE NOTE 8/23 admitted for COVID 19 pneumonia 8/25 remains on biPAP, high risk for intubation 8/26 remains hypoxic, high risk for intubation 8/28- patient took off his HFNC and got out of bed then collapsed on floor unresponsive and became cyanotic and severely hypoxemic.  I picked him up off the floor with help of [facility], intubated and placed central line , reviewed events with family.  Patient with severe ARDS critically ill.   CC  follow up respiratory failure  SUBJECTIVE Remains on high flow nasal cannula, guarded prognosis   BP 120/76   Pulse 72   Temp 100.1 F (37.8 C) (Axillary)   Resp 19   Ht 5\' 8"  (1.727 m)   Wt 93.2 kg   SpO2 98%   BMI 31.24 kg/m    I/O last 3 completed shifts: In: 118 [I.V.:118] Out: 1975 [Urine:1975] Total I/O In: -  Out: 125 [Urine:125]  SpO2: 98 % O2 Flow Rate (L/min): 45 L/min FiO2 (%): 100 %  Estimated body mass index is 31.24 kg/m as calculated from the following:   Height as of this encounter: 5\' 8"  (1.727 m).   Weight as of this encounter: 93.2 kg.    REVIEW OF SYSTEMS  PATIENT IS UNABLE TO PROVIDE COMPLETE REVIEW OF SYSTEMS DUE TO SEVERE CRITICAL ILLNESS    PHYSICAL EXAMINATION: Blood pressure 125/84, pulse (!) 123, temperature 97.8 F (36.6 C), resp. rate (!) 25, height 5\' 8"  (1.727 m), weight 93.2 kg, SpO2 92 %. Gen:      No acute distress HEENT:  EOMI, sclera anicteric Neck:     No masses; no thyromegaly Lungs:    Scattered crackles CV:         Regular rate and rhythm; no murmurs Abd:      + bowel sounds; soft, non-tender; no palpable masses, no distension Ext:    No edema; adequate peripheral perfusion Skin:      Warm and dry; no rash Neuro: alert and oriented x 3 Psych: normal mood and affect  Labs/imaging reviewed Significant for  Glucose 193, BUN/creatinine 24/0.79, WBC 16.2 No new imaging     MEDICATIONS: I have reviewed all medications and confirmed regimen as documented   CULTURE  RESULTS   Recent Results (from the past 240 hour(s))  Blood Culture (routine x 2)     Status: None   Collection Time: 03/12/2020 10:04 PM   Specimen: BLOOD  Result Value Ref Range Status   Specimen Description BLOOD RAC  Final   Special Requests BOTTLES DRAWN AEROBIC AND ANAEROBIC BCHV  Final   Culture   Final    NO GROWTH 5 DAYS Performed at [facility], [address] [place], [facility] [address]    Report Status 03/23/2020 FINAL  Final  Blood Culture (routine x 2)     Status: None   Collection Time: 02/26/2020 10:05 PM   Specimen: BLOOD  Result Value Ref Range Status   Specimen Description BLOOD RAC  Final   Special Requests BOTTLES DRAWN AEROBIC AND ANAEROBIC BCAV  Final   Culture   Final    NO GROWTH 5 DAYS Performed at [facility], [address], [place], [facility] [address]    Report Status 03/23/2020 FINAL  Final  MRSA PCR Screening     Status: Abnormal   Collection Time: 03/19/20  5:13 AM   Specimen: Nasopharyngeal  Result Value Ref Range Status   MRSA by PCR POSITIVE (A) NEGATIVE Corrected    Comment:  The GeneXpert MRSA Assay (FDA approved for NASAL specimens only), is one component of a comprehensive MRSA colonization surveillance program. It is not intended to diagnose MRSA infection nor to guide or monitor treatment for MRSA infections. RESULT CALLED TO, READ BACK BY AND VERIFIED WITH: Wynonia Sours RN AT 0715 ON 03/19/20 RH Performed at [facility], [address] [place], [place] [address] CORRECTED ON 08/25 AT 0757: PREVIOUSLY REPORTED AS POSITIVE        The GeneXpert MRSA Assay (FDA approved for NASAL specimens only), is one component of a comprehensive MRSA colonization surveillance program. It is not intended to diagnose MRSA infection  nor to guide or monitor treatment for MRSA infections. C/ ANNIE SMITH @ 5643 03/19/20 RH         BMP Latest Ref Rng & Units 03/23/2020 03/22/2020 03/21/2020  Glucose 70 - 99 mg/dL 329(J)  188(C) 166(A)  BUN 6 - 20 mg/dL 19 63(K) 16(W)  Creatinine 0.61 - 1.24 mg/dL 1.09 3.23 5.57  Sodium 135 - 145 mmol/L 130(L) 132(L) 137  Potassium 3.5 - 5.1 mmol/L 4.5 4.8 4.5  Chloride 98 - 111 mmol/L 95(L) 93(L) 100  CO2 22 - 32 mmol/L 29 26 27   Calcium 8.9 - 10.3 mg/dL 8.1(L) 8.7(L) 8.5(L)          Indwelling Urinary Catheter continued, requirement due to   Reason to continue Indwelling Urinary Catheter strict Intake/Output monitoring for hemodynamic instability      ASSESSMENT AND PLAN SYNOPSIS Acute hypoxic respiratory failure secondary to COVID-19 pneumonia, ARDS Tenuous respiratory status but no imminent need for intubation Continue self proning as tolerated Continue remdesivir, baracitnib, steroids Intermittent chest x-ray  Hyperglycemia Start SSI coverage  Wife updated 8/27 over telephone with spanish interpreter.     ELECTROLYTES -follow labs as needed -replace as needed -pharmacy consultation and following   DVT/GI PRX ordered and assessed-on Lovenox TRANSFUSIONS AS NEEDED MONITOR FSBS I Assessed the need for Labs I Assessed the need for Foley I Assessed the need for Central Venous Line Family Discussion when available I Assessed the need for Mobilization I made an Assessment of medications to be adjusted accordingly Safety Risk assessment completed   CASE DISCUSSED IN MULTIDISCIPLINARY ROUNDS WITH ICU TEAM  The patient is critically ill with multiple organ system failure and requires high complexity decision making for assessment and support, frequent evaluation and titration of therapies, advanced monitoring, review of radiographic studies and interpretation of complex data.   Critical Care Time devoted to patient care services, exclusive of separately billable procedures, described in this note is 35 minutes.    9/27, M.D.  Pulmonary & Critical Care Medicine  [facility]

## 2020-03-23 NOTE — Procedures (Signed)
Endotracheal Intubation: Patient required placement of an artificial airway secondary to Respiratory Failure  Consent: Emergent.   Hand washing performed prior to starting the procedure.   Medications administered for sedation prior to procedure:  Midazolam 2 mg IV,  Vecuronium 10 mg IV, Fentanyl 100 mcg IV.    A time out procedure was called and correct patient, name, & ID confirmed. Needed supplies and equipment were assembled and checked to include ETT, 10 ml syringe, Glidescope, Mac and Miller blades, suction, oxygen and bag mask valve, end tidal CO2 monitor.   Patient was positioned to align the mouth and pharynx to facilitate visualization of the glottis.   Heart rate, SpO2 and blood pressure was continuously monitored during the procedure. Pre-oxygenation was conducted prior to intubation and endotracheal tube was placed through the vocal cords into the trachea.     The artificial airway was placed under direct visualization via glidescope route using a 7.5 ETT on the first attempt.  ETT was secured at 24 cm mark.  Placement was confirmed by auscuitation of lungs with good breath sounds bilaterally and no stomach sounds.  Condensation was noted on endotracheal tube.   Pulse ox 98%.  CO2 detector in place with appropriate color change.   Complications: None .    Chest radiograph ordered and pending.   Comments: OGT placed via glidescope.   Ottie Glazier, M.D.  Pulmonary & [place]

## 2020-03-23 NOTE — Progress Notes (Signed)
Patient calling his family to discuss intubation, will also call family to discuss. NP at bedside

## 2020-03-23 NOTE — Progress Notes (Signed)
Was alerted that patient's heart monitor was off- patient found on the floor- unresponsive- patient was placed on his bed and  immediately intubated by Dr. Karna Christmas.  Patient now requiring 100% Fio2 on vent- saturation 93%- patient started waking up and moving all extremities- increased sedation- vitals stable and patient resting comfortably at this time.  Central line placed as well.

## 2020-03-23 NOTE — Procedures (Signed)
Central Venous Catheter Placement:TRIPLE LUMEN Indication: Patient receiving vesicant or irritant drug.; Patient receiving intravenous therapy for longer than 5 days.; Patient has limited or no vascular access.   Consent:emergent    Hand washing performed prior to starting the procedure.   Procedure:   An active timeout was performed and correct patient, name, & ID confirmed.   Patient was positioned correctly for central venous access.  Patient was prepped using strict sterile technique including chlorohexadine preps, sterile drape, sterile gown and sterile gloves.    The area was prepped, draped and anesthetized in the usual sterile manner. Patient comfort was obtained.    A triple lumen catheter was placed in Right Internal Jugular Vein There was good blood return, catheter caps were placed on lumens, catheter flushed easily, the line was secured and a sterile dressing and BIO-PATCH applied.   Ultrasound was used to visualize vasculature and guidance of needle.   Number of Attempts: 1 Complications:none Estimated Blood Loss: none Chest Radiograph indicated and ordered.      Larisha Vencill, M.D.  Pulmonary & Critical Care Medicine  [facility]     

## 2020-03-23 NOTE — Progress Notes (Signed)
Assisted tele visit to patient with family member.  Sheri Gatchel M, RN  

## 2020-03-23 NOTE — Progress Notes (Addendum)
Spoke to patient through interpreter- Patient stated he is not in respiratory distress(though his is on both HFNC and NRB mask) or having any pain- I asked if he had made any decision about being placed on the ventilator if need be and what he and his family has decided.  He stated that he is still communicating with his family and still have not made a decision yet.

## 2020-03-23 NOTE — Progress Notes (Signed)
Patient remains tachypnea, labored, with frequent O2 sats in the 70s/80s. Restlessness and agitation persists. Patient continues to refuse BIPAP and intubation. Discussed with NP

## 2020-03-24 LAB — BLOOD GAS, ARTERIAL
Acid-Base Excess: 3.6 mmol/L — ABNORMAL HIGH (ref 0.0–2.0)
Bicarbonate: 30.1 mmol/L — ABNORMAL HIGH (ref 20.0–28.0)
FIO2: 70
O2 Saturation: 89.3 %
PEEP: 12 cmH2O
Patient temperature: 37
Pressure control: 16 cmH2O
pCO2 arterial: 52 mmHg — ABNORMAL HIGH (ref 32.0–48.0)
pH, Arterial: 7.37 (ref 7.350–7.450)
pO2, Arterial: 59 mmHg — ABNORMAL LOW (ref 83.0–108.0)

## 2020-03-24 LAB — GLUCOSE, CAPILLARY
Glucose-Capillary: 221 mg/dL — ABNORMAL HIGH (ref 70–99)
Glucose-Capillary: 202 mg/dL — ABNORMAL HIGH (ref 70–99)
Glucose-Capillary: 185 mg/dL — ABNORMAL HIGH (ref 70–99)
Glucose-Capillary: 128 mg/dL — ABNORMAL HIGH (ref 70–99)
Glucose-Capillary: 118 mg/dL — ABNORMAL HIGH (ref 70–99)

## 2020-03-24 LAB — MAGNESIUM: Magnesium: 2.6 mg/dL — ABNORMAL HIGH (ref 1.7–2.4)

## 2020-03-24 LAB — PHOSPHORUS: Phosphorus: 3 mg/dL (ref 2.5–4.6)

## 2020-03-24 NOTE — Progress Notes (Signed)
Pharmacy Electrolyte Monitoring Consult:  Pharmacy consulted to assist in monitoring and replacing electrolytes in this 44 y.o. male admitted on 04/02/2020 with Respiratory Distress   Labs:  Sodium (mmol/L)  Date Value  03/23/2020 130 (L)   Potassium (mmol/L)  Date Value  03/23/2020 4.5   Magnesium (mg/dL)  Date Value  62/86/3817 2.6 (H)   Phosphorus (mg/dL)  Date Value  71/16/5790 3.0   Calcium (mg/dL)  Date Value  38/33/3832 8.1 (L)   Albumin (g/dL)  Date Value  91/91/6606 2.8 (L)    Assessment/Plan: 44 year old male admitted with acute hypoxic respiratory failure secondary to COVID-19 pneumonia.  Goal: electrolytes WNL  Electrolytes WNL. No replacement indicated at this time. Patient with good PO intake - possible intubation. Continue to monitor.  Paschal Dopp, PharmD, BCPS Clinical Pharmacist 03/24/2020 8:45 AM

## 2020-03-24 NOTE — Progress Notes (Signed)
Assisted tele visit to patient with family member.  Param Capri M, RN  

## 2020-03-25 LAB — CBC
HCT: 41.1 % (ref 39.0–52.0)
Hemoglobin: 13.2 g/dL (ref 13.0–17.0)
MCH: 30.6 pg (ref 26.0–34.0)
MCHC: 32.1 g/dL (ref 30.0–36.0)
MCV: 95.1 fL (ref 80.0–100.0)
Platelets: 376 10*3/uL (ref 150–400)
RBC: 4.32 MIL/uL (ref 4.22–5.81)
RDW: 13.5 % (ref 11.5–15.5)
WBC: 15.6 10*3/uL — ABNORMAL HIGH (ref 4.0–10.5)
nRBC: 0 % (ref 0.0–0.2)

## 2020-03-25 LAB — BASIC METABOLIC PANEL
Anion gap: 7 (ref 5–15)
BUN: 50 mg/dL — ABNORMAL HIGH (ref 6–20)
CO2: 28 mmol/L (ref 22–32)
Calcium: 7.9 mg/dL — ABNORMAL LOW (ref 8.9–10.3)
Chloride: 104 mmol/L (ref 98–111)
Creatinine, Ser: 1.52 mg/dL — ABNORMAL HIGH (ref 0.61–1.24)
GFR calc Af Amer: >60 mL/min (ref >60)
GFR calc non Af Amer: 55 mL/min — ABNORMAL LOW (ref >60)
Glucose, Bld: 162 mg/dL — ABNORMAL HIGH (ref 70–99)
Potassium: 5.4 mmol/L — ABNORMAL HIGH (ref 3.5–5.1)
Sodium: 139 mmol/L (ref 135–145)

## 2020-03-25 LAB — GLUCOSE, CAPILLARY
Glucose-Capillary: 115 mg/dL — ABNORMAL HIGH (ref 70–99)
Glucose-Capillary: 116 mg/dL — ABNORMAL HIGH (ref 70–99)
Glucose-Capillary: 130 mg/dL — ABNORMAL HIGH (ref 70–99)
Glucose-Capillary: 146 mg/dL — ABNORMAL HIGH (ref 70–99)
Glucose-Capillary: 158 mg/dL — ABNORMAL HIGH (ref 70–99)
Glucose-Capillary: 167 mg/dL — ABNORMAL HIGH (ref 70–99)

## 2020-03-25 LAB — MAGNESIUM: Magnesium: 3.1 mg/dL — ABNORMAL HIGH (ref 1.7–2.4)

## 2020-03-25 LAB — PHOSPHORUS: Phosphorus: 4.3 mg/dL (ref 2.5–4.6)

## 2020-03-25 MED ORDER — KETOROLAC TROMETHAMINE 15 MG/ML IJ SOLN
15.0000 mg | Freq: Once | INTRAMUSCULAR | Status: AC
  Administered 2020-03-25: 15 mg via INTRAVENOUS
  Filled 2020-03-25: qty 1

## 2020-03-25 MED FILL — Sodium Chloride IV Soln 0.9%: INTRAVENOUS | Qty: 250 | Status: AC

## 2020-03-25 MED FILL — Norepinephrine Bitartrate IV Soln 1 MG/ML (Base Equivalent): INTRAVENOUS | Qty: 4 | Status: AC

## 2020-03-25 NOTE — Progress Notes (Signed)
eLink Physician-Brief Progress Note Patient Name: Donald Mccall DOB: August 16, 1975 MRN: [id number]   Date of Service  03/25/2020  HPI/Events of Note  Fever. Already received tylenol.  eICU Interventions  Cooling blanket and toradol 15 mg IV ordered.     Intervention Category Intermediate Interventions: Other:  Janae Bridgeman 03/25/2020, 4:50 AM

## 2020-03-25 NOTE — Progress Notes (Addendum)
CRITICAL CARE NOTE 8/23 admitted for COVID 19 pneumonia 8/25 remains on biPAP, high risk for intubation 8/26 remains hypoxic, high risk for intubation 8/28- patient took off his HFNC and got out of bed then collapsed on floor unresponsive and became cyanotic and severely hypoxemic.  I picked him up off the floor with help of [facility], intubated and placed central line , reviewed events with family.  Patient with severe ARDS critically ill.  8/30- patient remains on MV, weaned FIO2 from 100>>75, critically ill with ARDS  CC  follow up respiratory failure  SUBJECTIVE Remains on high flow nasal cannula, guarded prognosis   BP 102/67   Pulse 88   Temp (!) 101.1 F (38.4 C)   Resp 19   Ht 5\' 8"  (1.727 m)   Wt 93.5 kg   SpO2 91%   BMI 31.34 kg/m    I/O last 3 completed shifts: In: 3235 [I.V.:3235] Out: 3400 [Urine:3400] Total I/O In: -  Out: 1000 [Urine:1000]  SpO2: 91 % O2 Flow Rate (L/min): 45 L/min FiO2 (%): 75 %  Estimated body mass index is 31.34 kg/m as calculated from the following:   Height as of this encounter: 5\' 8"  (1.727 m).   Weight as of this encounter: 93.5 kg.    REVIEW OF SYSTEMS  PATIENT IS UNABLE TO PROVIDE COMPLETE REVIEW OF SYSTEMS DUE TO SEVERE CRITICAL ILLNESS    PHYSICAL EXAMINATION: Blood pressure 125/84, pulse (!) 123, temperature 97.8 F (36.6 C), resp. rate (!) 25, height 5\' 8"  (1.727 m), weight 93.2 kg, SpO2 92 %. Gen:      No acute distress GCS3T HEENT:  EOMI, sclera anicteric Neck:     No masses; no thyromegaly Lungs:    Scattered crackles CV:         Regular rate and rhythm; no murmurs Abd:      + bowel sounds; soft, non-tender; no palpable masses, no distension Ext:    No edema; adequate peripheral perfusion Skin:      Warm and dry; no rash3 Psych: normal mood and affect  Labs/imaging reviewed Significant for  Glucose 193, BUN/creatinine 24/0.79, WBC 16.2 No new imaging     MEDICATIONS: I have reviewed all medications  and confirmed regimen as documented   CULTURE RESULTS   Recent Results (from the past 240 hour(s))  Blood Culture (routine x 2)     Status: None   Collection Time: April 03, 2020 10:04 PM   Specimen: BLOOD  Result Value Ref Range Status   Specimen Description BLOOD RAC  Final   Special Requests BOTTLES DRAWN AEROBIC AND ANAEROBIC BCHV  Final   Culture   Final    NO GROWTH 5 DAYS Performed at [facility], [address] [place], [facility] [address]    Report Status 03/23/2020 FINAL  Final  Blood Culture (routine x 2)     Status: None   Collection Time: 2020/04/03 10:05 PM   Specimen: BLOOD  Result Value Ref Range Status   Specimen Description BLOOD RAC  Final   Special Requests BOTTLES DRAWN AEROBIC AND ANAEROBIC BCAV  Final   Culture   Final    NO GROWTH 5 DAYS Performed at [facility], [address], [place], [facility] [address]    Report Status 03/23/2020 FINAL  Final  MRSA PCR Screening     Status: Abnormal   Collection Time: 03/19/20  5:13 AM   Specimen: Nasopharyngeal  Result Value Ref Range Status   MRSA by PCR POSITIVE (A)  NEGATIVE Corrected    Comment:        The GeneXpert MRSA Assay (FDA approved for NASAL specimens only), is one component of a comprehensive MRSA colonization surveillance program. It is not intended to diagnose MRSA infection nor to guide or monitor treatment for MRSA infections. RESULT CALLED TO, READ BACK BY AND VERIFIED WITH: Wynonia Sours RN AT 0715 ON 03/19/20 RH Performed at [facility], [address] [place], [place] [address] CORRECTED ON 08/25 AT 0757: PREVIOUSLY REPORTED AS POSITIVE        The GeneXpert MRSA Assay (FDA approved for NASAL specimens only), is one component of a comprehensive MRSA colonization surveillance program. It is not intended to diagnose MRSA infection  nor to guide or monitor treatment for MRSA infections. C/ ANNIE SMITH @ 0240 03/19/20 RH         BMP Latest Ref Rng & Units 03/25/2020  03/23/2020 03/22/2020  Glucose 70 - 99 mg/dL 973(Z) 329(J) 242(A)  BUN 6 - 20 mg/dL 83(M) 19 19(Q)  Creatinine 0.61 - 1.24 mg/dL 2.22(L) 7.98 9.21  Sodium 135 - 145 mmol/L 139 130(L) 132(L)  Potassium 3.5 - 5.1 mmol/L 5.4(H) 4.5 4.8  Chloride 98 - 111 mmol/L 104 95(L) 93(L)  CO2 22 - 32 mmol/L 28 29 26   Calcium 8.9 - 10.3 mg/dL 7.9(L) 8.1(L) 8.7(L)          Indwelling Urinary Catheter continued, requirement due to   Reason to continue Indwelling Urinary Catheter strict Intake/Output monitoring for hemodynamic instability      ASSESSMENT AND PLAN  Acute hypoxic respiratory failure secondary to COVID-19 pneumonia, ARDS Acute COVID19 pneumonia -Remdesevir antiviral - pharmacy protocol 5 d -vitamin C -zinc -decadron 6mg  IV daily  -Diuresis - Lasix 40 IV daily - monitor UOP - utilize external urinary catheter if possible -Self prone if patient can tolerate  -encourage to use IS and Acapella device for bronchopulmonary hygiene when able -consider Actemra if CRP increments >20 -d/c hepatotoxic medications while on remdesevir -supportive care with ICU telemetry monitoring -PT/OT when possible -procalcitonin, CRP and ferritin trending -baricitinib   Hyperglycemia Start SSI coverage   AKI   D/c nonessential nephrotoxins - asprin, zinc, barctinib     ELECTROLYTES -follow labs as needed -replace as needed -pharmacy consultation and following   DVT/GI PRX ordered and assessed-on Lovenox TRANSFUSIONS AS NEEDED MONITOR FSBS I Assessed the need for Labs I Assessed the need for Foley I Assessed the need for Central Venous Line Family Discussion when available I Assessed the need for Mobilization I made an Assessment of medications to be adjusted accordingly Safety Risk assessment completed   CASE DISCUSSED IN MULTIDISCIPLINARY ROUNDS WITH ICU TEAM  The patient is critically ill with multiple organ system failure and requires high complexity decision making for  assessment and support, frequent evaluation and titration of therapies, advanced monitoring, review of radiographic studies and interpretation of complex data.   Critical Care Time devoted to patient care services, exclusive of separately billable procedures, described in this note is 35 minutes.    , M.D.  Pulmonary & Critical Care Medicine  [facility]

## 2020-03-25 NOTE — Progress Notes (Signed)
Orders for toradol and cooling blanket given. Unable to apply cooling blanket as there are no machines left in the hospital

## 2020-03-25 NOTE — Progress Notes (Signed)
[facility] contacted re pt temp. They will call back.

## 2020-03-25 NOTE — Progress Notes (Signed)
1000  Cell phone picked up per wife.

## 2020-03-25 NOTE — Progress Notes (Signed)
Pharmacy Electrolyte Monitoring Consult:  Pharmacy consulted to assist in monitoring and replacing electrolytes in this 44 y.o. male admitted on 03/20/2020 with Respiratory Distress   Labs:  Sodium (mmol/L)  Date Value  03/25/2020 139   Potassium (mmol/L)  Date Value  03/25/2020 5.4 (H)   Magnesium (mg/dL)  Date Value  69/62/9528 3.1 (H)   Phosphorus (mg/dL)  Date Value  41/32/4401 4.3   Calcium (mg/dL)  Date Value  02/72/5366 7.9 (L)   Albumin (g/dL)  Date Value  44/09/4740 2.8 (L)   Corrected Ca: 8.86 mg/dL  Assessment/Plan: 44 year old male admitted with acute hypoxic respiratory failure secondary to COVID-19 pneumonia. UOP 1650 mL/ previous 24 hours  No replacement indicated at this time.   Continue to monitor.  Burnis Medin, PharmD, BCPS Clinical Pharmacist 03/25/2020 9:13 AM

## 2020-03-25 NOTE — Progress Notes (Signed)
Still no cooling blanket available. Given Tylenol per OG. Ice packs placed axillary x 2.

## 2020-03-26 ENCOUNTER — Inpatient Hospital Stay: Payer: HRSA Program

## 2020-03-26 ENCOUNTER — Inpatient Hospital Stay: Admit: 2020-03-26 | Payer: HRSA Program

## 2020-03-26 LAB — GLUCOSE, CAPILLARY
Glucose-Capillary: 121 mg/dL — ABNORMAL HIGH (ref 70–99)
Glucose-Capillary: 145 mg/dL — ABNORMAL HIGH (ref 70–99)
Glucose-Capillary: 149 mg/dL — ABNORMAL HIGH (ref 70–99)
Glucose-Capillary: 154 mg/dL — ABNORMAL HIGH (ref 70–99)
Glucose-Capillary: 170 mg/dL — ABNORMAL HIGH (ref 70–99)
Glucose-Capillary: 173 mg/dL — ABNORMAL HIGH (ref 70–99)
Glucose-Capillary: 81 mg/dL (ref 70–99)

## 2020-03-26 LAB — BASIC METABOLIC PANEL
Anion gap: 7 (ref 5–15)
BUN: 33 mg/dL — ABNORMAL HIGH (ref 6–20)
CO2: 29 mmol/L (ref 22–32)
Calcium: 8 mg/dL — ABNORMAL LOW (ref 8.9–10.3)
Chloride: 103 mmol/L (ref 98–111)
Creatinine, Ser: 0.88 mg/dL (ref 0.61–1.24)
GFR calc Af Amer: >60 mL/min (ref >60)
GFR calc non Af Amer: >60 mL/min (ref >60)
Glucose, Bld: 175 mg/dL — ABNORMAL HIGH (ref 70–99)
Potassium: 5.3 mmol/L — ABNORMAL HIGH (ref 3.5–5.1)
Sodium: 139 mmol/L (ref 135–145)

## 2020-03-26 LAB — CBC
HCT: 39.6 % (ref 39.0–52.0)
Hemoglobin: 13.2 g/dL (ref 13.0–17.0)
MCH: 31.1 pg (ref 26.0–34.0)
MCHC: 33.3 g/dL (ref 30.0–36.0)
MCV: 93.4 fL (ref 80.0–100.0)
Platelets: 329 10*3/uL (ref 150–400)
RBC: 4.24 MIL/uL (ref 4.22–5.81)
RDW: 13.5 % (ref 11.5–15.5)
WBC: 18.4 10*3/uL — ABNORMAL HIGH (ref 4.0–10.5)
nRBC: 0 % (ref 0.0–0.2)

## 2020-03-26 LAB — PHOSPHORUS: Phosphorus: 3.3 mg/dL (ref 2.5–4.6)

## 2020-03-26 LAB — TRIGLYCERIDES: Triglycerides: 314 mg/dL — ABNORMAL HIGH (ref <150)

## 2020-03-26 LAB — MAGNESIUM: Magnesium: 2.8 mg/dL — ABNORMAL HIGH (ref 1.7–2.4)

## 2020-03-26 MED ORDER — MIDAZOLAM HCL 2 MG/2ML IJ SOLN
INTRAMUSCULAR | Status: AC
  Administered 2020-03-26: 2 mg
  Filled 2020-03-26: qty 2

## 2020-03-26 MED ORDER — FENTANYL 2500MCG IN NS 250ML (10MCG/ML) PREMIX INFUSION
0.0000 ug/h | INTRAVENOUS | Status: DC
  Administered 2020-03-26 – 2020-03-27 (×2): 350 ug/h via INTRAVENOUS
  Administered 2020-03-27: 300 ug/h via INTRAVENOUS
  Administered 2020-03-27: 350 ug/h via INTRAVENOUS
  Administered 2020-03-27 – 2020-03-30 (×10): 300 ug/h via INTRAVENOUS
  Filled 2020-03-26 (×14): qty 250

## 2020-03-26 MED ORDER — VITAL HIGH PROTEIN PO LIQD
1000.0000 mL | ORAL | Status: DC
  Administered 2020-03-27: 1000 mL

## 2020-03-26 MED ORDER — FENTANYL BOLUS VIA INFUSION
100.0000 ug | Freq: Once | INTRAVENOUS | Status: AC
  Administered 2020-03-26: 100 ug via INTRAVENOUS

## 2020-03-26 MED ORDER — ADULT MULTIVITAMIN W/MINERALS CH
1.0000 | ORAL_TABLET | Freq: Every day | ORAL | Status: DC
  Administered 2020-03-27 – 2020-03-30 (×4): 1
  Filled 2020-03-26 (×4): qty 1

## 2020-03-26 MED ORDER — SODIUM CHLORIDE 0.9 % IV SOLN
0.0000 ug/h | INTRAVENOUS | Status: DC
  Administered 2020-03-26: 300 ug/h via INTRAVENOUS
  Filled 2020-03-26: qty 50

## 2020-03-26 MED ORDER — MIDAZOLAM HCL 2 MG/2ML IJ SOLN
2.0000 mg | INTRAMUSCULAR | Status: DC | PRN
  Administered 2020-03-26 – 2020-03-28 (×6): 2 mg via INTRAVENOUS
  Filled 2020-03-26 (×5): qty 2

## 2020-03-26 MED FILL — Sodium Chloride IV Soln 0.9%: INTRAVENOUS | Qty: 250 | Status: AC

## 2020-03-26 MED FILL — Norepinephrine Bitartrate IV Soln 1 MG/ML (Base Equivalent): INTRAVENOUS | Qty: 4 | Status: AC

## 2020-03-26 NOTE — Progress Notes (Signed)
Assisted tele visit to patient with mother.  Thomas, Arron Tetrault Renee, RN   

## 2020-03-26 NOTE — Progress Notes (Addendum)
1630 Patient extremely agitated and attempting to extubate self. Given prn Versed 2 mg as ordered. Oxygen saturation decreased with episode of agitation and did not return to previous  levels. FIO2 increased to 75% to maintain oxygen saturations of 91% per pulse oximeter

## 2020-03-26 NOTE — Progress Notes (Signed)
CRITICAL CARE NOTE 8/23 admitted for COVID 19 pneumonia 8/25 remains on biPAP, high risk for intubation 8/26 remains hypoxic, high risk for intubation 8/28- patient took off his HFNC and got out of bed then collapsed on floor unresponsive and became cyanotic and severely hypoxemic.  I picked him up off the floor with help of [facility], intubated and placed central line , reviewed events with family.  Patient with severe ARDS critically ill.  8/30- patient remains on MV, weaned FIO2 from 100>>75, critically ill with ARDS 03/26/20- patient weaned to 65%-FiO2. Spoke to Son [facility] [place]ella [place]) with interpreter service.   CC  follow up respiratory failure  SUBJECTIVE Remains on high flow nasal cannula, guarded prognosis   BP 108/72   Pulse 89   Temp 100 F (37.8 C)   Resp 18   Ht 5\' 8"  (1.727 m)   Wt 86.2 kg   SpO2 (!) 89%   BMI 28.89 kg/m    I/O last 3 completed shifts: In: 3412.2 [I.V.:3382.2; NG/GT:30] Out: 3160 [Urine:3150; Emesis/NG output:10] Total I/O In: -  Out: 500 [Urine:500]  SpO2: (!) 89 % O2 Flow Rate (L/min): 45 L/min FiO2 (%): (S) 65 %  Estimated body mass index is 28.89 kg/m as calculated from the following:   Height as of this encounter: 5\' 8"  (1.727 m).   Weight as of this encounter: 86.2 kg.    REVIEW OF SYSTEMS  PATIENT IS UNABLE TO PROVIDE COMPLETE REVIEW OF SYSTEMS DUE TO SEVERE CRITICAL ILLNESS    PHYSICAL EXAMINATION: Blood pressure 125/84, pulse (!) 123, temperature 97.8 F (36.6 C), resp. rate (!) 25, height 5\' 8"  (1.727 m), weight 93.2 kg, SpO2 92 %. Gen:      No acute distress HEENT:  EOMI, sclera anicteric Neck:     No masses; no thyromegaly Lungs:    Scattered crackles CV:         Regular rate and rhythm; no murmurs Abd:      + bowel sounds; soft, non-tender; no palpable masses, no distension Ext:    No edema; adequate peripheral perfusion Skin:      Warm and dry; no rash3 Neurology - GCS3T     MEDICATIONS: I have reviewed all  medications and confirmed regimen as documented   CULTURE RESULTS   Recent Results (from the past 240 hour(s))  Blood Culture (routine x 2)     Status: None   Collection Time: 03/23/2020 10:04 PM   Specimen: BLOOD  Result Value Ref Range Status   Specimen Description BLOOD RAC  Final   Special Requests BOTTLES DRAWN AEROBIC AND ANAEROBIC BCHV  Final   Culture   Final    NO GROWTH 5 DAYS Performed at [facility], [address] [place], [facility] [address]    Report Status 03/23/2020 FINAL  Final  Blood Culture (routine x 2)     Status: None   Collection Time: 03/16/2020 10:05 PM   Specimen: BLOOD  Result Value Ref Range Status   Specimen Description BLOOD RAC  Final   Special Requests BOTTLES DRAWN AEROBIC AND ANAEROBIC BCAV  Final   Culture   Final    NO GROWTH 5 DAYS Performed at [facility], [address], [place], [facility] [address]    Report Status 03/23/2020 FINAL  Final  MRSA PCR Screening     Status: Abnormal   Collection Time: 03/19/20  5:13 AM   Specimen: Nasopharyngeal  Result Value Ref Range Status   MRSA by PCR  POSITIVE (A) NEGATIVE Corrected    Comment:        The GeneXpert MRSA Assay (FDA approved for NASAL specimens only), is one component of a comprehensive MRSA colonization surveillance program. It is not intended to diagnose MRSA infection nor to guide or monitor treatment for MRSA infections. RESULT CALLED TO, READ BACK BY AND VERIFIED WITH: Wynonia Sours RN AT 0715 ON 03/19/20 RH Performed at [facility], [address] [place], [place] [address] CORRECTED ON 08/25 AT 0757: PREVIOUSLY REPORTED AS POSITIVE        The GeneXpert MRSA Assay (FDA approved for NASAL specimens only), is one component of a comprehensive MRSA colonization surveillance program. It is not intended to diagnose MRSA infection  nor to guide or monitor treatment for MRSA infections. C/ ANNIE SMITH @ 2751 03/19/20 RH         BMP Latest Ref Rng & Units  03/26/2020 03/25/2020 03/23/2020  Glucose 70 - 99 mg/dL 700(F) 749(S) 496(P)  BUN 6 - 20 mg/dL 59(F) 63(W) 19  Creatinine 0.61 - 1.24 mg/dL 4.66 5.99(J) 5.70  Sodium 135 - 145 mmol/L 139 139 130(L)  Potassium 3.5 - 5.1 mmol/L 5.3(H) 5.4(H) 4.5  Chloride 98 - 111 mmol/L 103 104 95(L)  CO2 22 - 32 mmol/L 29 28 29   Calcium 8.9 - 10.3 mg/dL 8.0(L) 7.9(L) 8.1(L)          Indwelling Urinary Catheter continued, requirement due to   Reason to continue Indwelling Urinary Catheter strict Intake/Output monitoring for hemodynamic instability      ASSESSMENT AND PLAN  Acute hypoxic respiratory failure secondary to COVID-19 pneumonia, ARDS Acute COVID19 pneumonia -Remdesevir antiviral - pharmacy protocol 5 d -vitamin C -zinc -decadron 6mg  IV daily  -Diuresis - Lasix 40 IV daily - monitor UOP - utilize external urinary catheter if possible -Self prone if patient can tolerate  -encourage to use IS and Acapella device for bronchopulmonary hygiene when able -consider Actemra if CRP increments >20 -d/c hepatotoxic medications while on remdesevir -supportive care with ICU telemetry monitoring -PT/OT when possible -procalcitonin, CRP and ferritin trending -baricitinib   Hyperglycemia Start SSI coverage   AKI   D/c nonessential nephrotoxins - asprin, zinc, barctinib     ELECTROLYTES -follow labs as needed -replace as needed -pharmacy consultation and following   DVT/GI PRX ordered and assessed-on Lovenox TRANSFUSIONS AS NEEDED MONITOR FSBS I Assessed the need for Labs I Assessed the need for Foley I Assessed the need for Central Venous Line Family Discussion when available I Assessed the need for Mobilization I made an Assessment of medications to be adjusted accordingly Safety Risk assessment completed   CASE DISCUSSED IN MULTIDISCIPLINARY ROUNDS WITH ICU TEAM  The patient is critically ill with multiple organ system failure and requires high complexity decision making  for assessment and support, frequent evaluation and titration of therapies, advanced monitoring, review of radiographic studies and interpretation of complex data.   Critical Care Time devoted to patient care services, exclusive of separately billable procedures, described in this note is 35 minutes.    , M.D.  Pulmonary & Critical Care Medicine  [facility]

## 2020-03-26 NOTE — Progress Notes (Addendum)
Initial Nutrition Assessment  DOCUMENTATION CODES:   Not applicable  INTERVENTION:  Once OGT is advanced and placement is confirmed by x-ray, initiate Vital High Protein at trickle rate of 20 mL/hr per tube today.  Tomorrow recommend advancing to goal regimen of Vital High Protein at 45 mL/hr (1080 mL goal daily volume) per tube + PROSource TF 45 mL TID per tube. Provides 1200 kcal, 128 grams of protein, 907 mL H2O daily. With current propofol rate provides 2087 kcal daily.  Provide MVI daily per tube.  Monitor magnesium, potassium, and phosphorus daily for at least 3 days, MD to replete as needed, as pt is at risk for refeeding syndrome.  NUTRITION DIAGNOSIS:   Inadequate oral intake related to inability to eat as evidenced by NPO status.  GOAL:   Patient will meet greater than or equal to 90% of their needs  MONITOR:   Vent status, Labs, Weight trends, TF tolerance, I & O's  REASON FOR ASSESSMENT:   Ventilator    ASSESSMENT:   44 year old male admitted in 8/23 with COVID-19 PNA, required intubation on 8/28 after taking off HFNC and collapsing on floor.   Patient is currently intubated on ventilator support MV: 13 L/min Temp (24hrs), Avg:100.8 F (38.2 C), Min:99.5 F (37.5 C), Max:101.8 F (38.8 C)  Propofol: 36.6 ml/hr (887 kcal daily)  Medications reviewed and include: vitamin C 500 mg daily, Decadron 6 mg Q24hrs IV, Novolog 0-15 units Q4hrs, Protonix, Precedex gtt, fentanyl gtt, propofol gtt.  Labs reviewed: CBG 121-173, Potassium 5.3, BUN 33, Magnesium 2.8.  I/O: 2600 mL UOP yesterday (1.3 mL/kg/hr)  Weight trend: 86.2 kg on 8/31; -6.6 kg from 8/24  Enteral Access: OGT placed 8/28; per chest x-ray from 8/28 tube is in gastric cardia with side port just above GE junction and advancement is recommended  Discussed with RN and on rounds. Plan is to start trickle feeds today and advance tomorrow if patient tolerates. Plan is to advance OGT and repeat x-ray  today.  NUTRITION - FOCUSED PHYSICAL EXAM:  Deferred.  Diet Order:   Diet Order    None     EDUCATION NEEDS:   No education needs have been identified at this time  Skin:  Skin Assessment: Reviewed RN Assessment  Last BM:  03/21/2020 per chart  Height:   Ht Readings from Last 1 Encounters:  03/19/20 5\' 8"  (1.727 m)   Weight:   Wt Readings from Last 1 Encounters:  03/26/20 86.2 kg   Ideal Body Weight:  70 kg  BMI:  Body mass index is 28.89 kg/m.  Estimated Nutritional Needs:   Kcal:  2011  Protein:  130-140 grams  Fluid:  >/= 2 L/day  2012, MS, RD, LDN Pager number available on Amion

## 2020-03-27 ENCOUNTER — Inpatient Hospital Stay (HOSPITAL_COMMUNITY)
Admit: 2020-03-27 | Discharge: 2020-03-27 | Disposition: A | Discharge status: Home / Self Care | Payer: HRSA Program | Attending: Pulmonary Disease | Admitting: Pulmonary Disease

## 2020-03-27 DIAGNOSIS — I313 Pericardial effusion (noninflammatory): Secondary | ICD-10-CM

## 2020-03-27 DIAGNOSIS — U071 COVID-19: Secondary | ICD-10-CM

## 2020-03-27 LAB — CBC
HCT: 39.7 % (ref 39.0–52.0)
Hemoglobin: 13 g/dL (ref 13.0–17.0)
MCH: 30.9 pg (ref 26.0–34.0)
MCHC: 32.7 g/dL (ref 30.0–36.0)
MCV: 94.3 fL (ref 80.0–100.0)
Platelets: 302 10*3/uL (ref 150–400)
RBC: 4.21 MIL/uL — ABNORMAL LOW (ref 4.22–5.81)
RDW: 13.5 % (ref 11.5–15.5)
WBC: 23.1 10*3/uL — ABNORMAL HIGH (ref 4.0–10.5)
nRBC: 0 % (ref 0.0–0.2)

## 2020-03-27 LAB — BLOOD GAS, ARTERIAL
Acid-Base Excess: 5.9 mmol/L — ABNORMAL HIGH (ref 0.0–2.0)
Bicarbonate: 34.6 mmol/L — ABNORMAL HIGH (ref 20.0–28.0)
FIO2: 85
Mechanical Rate: 20
O2 Saturation: 92.6 %
PEEP: 12 cmH2O
Patient temperature: 37
Pressure control: 16 cmH2O
pCO2 arterial: 72 mmHg (ref 32.0–48.0)
pH, Arterial: 7.29 — ABNORMAL LOW (ref 7.350–7.450)
pO2, Arterial: 73 mmHg — ABNORMAL LOW (ref 83.0–108.0)

## 2020-03-27 LAB — GLUCOSE, CAPILLARY
Glucose-Capillary: 150 mg/dL — ABNORMAL HIGH (ref 70–99)
Glucose-Capillary: 166 mg/dL — ABNORMAL HIGH (ref 70–99)
Glucose-Capillary: 176 mg/dL — ABNORMAL HIGH (ref 70–99)
Glucose-Capillary: 179 mg/dL — ABNORMAL HIGH (ref 70–99)
Glucose-Capillary: 182 mg/dL — ABNORMAL HIGH (ref 70–99)
Glucose-Capillary: 193 mg/dL — ABNORMAL HIGH (ref 70–99)

## 2020-03-27 LAB — BASIC METABOLIC PANEL
Anion gap: 7 (ref 5–15)
BUN: 29 mg/dL — ABNORMAL HIGH (ref 6–20)
CO2: 33 mmol/L — ABNORMAL HIGH (ref 22–32)
Calcium: 8.3 mg/dL — ABNORMAL LOW (ref 8.9–10.3)
Chloride: 101 mmol/L (ref 98–111)
Creatinine, Ser: 0.89 mg/dL (ref 0.61–1.24)
GFR calc Af Amer: >60 mL/min (ref >60)
GFR calc non Af Amer: >60 mL/min (ref >60)
Glucose, Bld: 192 mg/dL — ABNORMAL HIGH (ref 70–99)
Potassium: 5.4 mmol/L — ABNORMAL HIGH (ref 3.5–5.1)
Sodium: 141 mmol/L (ref 135–145)

## 2020-03-27 LAB — ECHOCARDIOGRAM COMPLETE
Height: 68 in
Weight: 3054.69 oz

## 2020-03-27 LAB — PHOSPHORUS: Phosphorus: 3 mg/dL (ref 2.5–4.6)

## 2020-03-27 LAB — MAGNESIUM: Magnesium: 2.5 mg/dL — ABNORMAL HIGH (ref 1.7–2.4)

## 2020-03-27 MED ORDER — ENOXAPARIN SODIUM 40 MG/0.4ML ~~LOC~~ SOLN
40.0000 mg | SUBCUTANEOUS | Status: DC
  Administered 2020-03-27 – 2020-03-29 (×3): 40 mg via SUBCUTANEOUS
  Filled 2020-03-27 (×3): qty 0.4

## 2020-03-27 MED ORDER — DEXTROSE 50 % IV SOLN
12.5000 g | Freq: Once | INTRAVENOUS | Status: AC
  Administered 2020-03-27: 12.5 g via INTRAVENOUS
  Filled 2020-03-27: qty 50

## 2020-03-27 MED ORDER — VITAL HIGH PROTEIN PO LIQD
1000.0000 mL | ORAL | Status: DC
  Administered 2020-03-28 – 2020-03-29 (×2): 1000 mL

## 2020-03-27 MED ORDER — PROSOURCE TF PO LIQD
45.0000 mL | Freq: Three times a day (TID) | ORAL | Status: DC
  Administered 2020-03-27 – 2020-03-29 (×6): 45 mL
  Filled 2020-03-27 (×8): qty 45

## 2020-03-27 MED ORDER — INSULIN ASPART 100 UNIT/ML IV SOLN
10.0000 [IU] | Freq: Once | INTRAVENOUS | Status: AC
  Administered 2020-03-27: 10 [IU] via INTRAVENOUS
  Filled 2020-03-27: qty 0.1

## 2020-03-27 NOTE — Progress Notes (Signed)
CRITICAL CARE NOTE 8/23 admitted for COVID 19 pneumonia 8/25 remains on biPAP, high risk for intubation 8/26 remains hypoxic, high risk for intubation 8/28- patient took off his HFNC and got out of bed then collapsed on floor unresponsive and became cyanotic and severely hypoxemic.  I picked him up off the floor with help of [facility], intubated and placed central line , reviewed events with family.  Patient with severe ARDS critically ill.  8/30- patient remains on MV, weaned FIO2 from 100>>75, critically ill with ARDS 03/26/20- patient weaned to 65%-FiO2. Spoke to Son [facility]ella R[place]) with interpreter service.  9/1- Patient is still on 65%, severe ards  CC  follow up respiratory failure  SUBJECTIVE Remains on high flow nasal cannula, guarded prognosis   BP 108/71    Pulse 95    Temp 98.5 F (36.9 C) (Esophageal)    Resp (!) 22    Ht 5\' 8"  (1.727 m)    Wt 86.6 kg    SpO2 93%    BMI 29.03 kg/m    I/O last 3 completed shifts: In: 3358.3 [I.V.:3263.3; Other:45; NG/GT:50] Out: 3580 [Urine:3520; Emesis/NG output:60] Total I/O In: 841.5 [I.V.:676.5; NG/GT:165] Out: 550 [Urine:550]  SpO2: 93 % O2 Flow Rate (L/min): 45 L/min FiO2 (%): 85 %  Estimated body mass index is 29.03 kg/m as calculated from the following:   Height as of this encounter: 5\' 8"  (1.727 m).   Weight as of this encounter: 86.6 kg.    REVIEW OF SYSTEMS  PATIENT IS UNABLE TO PROVIDE COMPLETE REVIEW OF SYSTEMS DUE TO SEVERE CRITICAL ILLNESS    PHYSICAL EXAMINATION: Blood pressure 125/84, pulse (!) 123, temperature 97.8 F (36.6 C), resp. rate (!) 25, height 5\' 8"  (1.727 m), weight 93.2 kg, SpO2 92 %. Gen:      No acute distress HEENT:  EOMI, sclera anicteric Neck:     No masses; no thyromegaly Lungs:    Scattered crackles CV:         Regular rate and rhythm; no murmurs Abd:      + bowel sounds; soft, non-tender; no palpable masses, no distension Ext:    No edema; adequate peripheral perfusion Skin:       Warm and dry; no rash3 Neurology - GCS3T     MEDICATIONS: I have reviewed all medications and confirmed regimen as documented   CULTURE RESULTS   Recent Results (from the past 240 hour(s))  Blood Culture (routine x 2)     Status: None   Collection Time: 03/22/2020 10:04 PM   Specimen: BLOOD  Result Value Ref Range Status   Specimen Description BLOOD RAC  Final   Special Requests BOTTLES DRAWN AEROBIC AND ANAEROBIC BCHV  Final   Culture   Final    NO GROWTH 5 DAYS Performed at [facility], [address] [place], [facility] [address]    Report Status 03/23/2020 FINAL  Final  Blood Culture (routine x 2)     Status: None   Collection Time: 03/22/2020 10:05 PM   Specimen: BLOOD  Result Value Ref Range Status   Specimen Description BLOOD RAC  Final   Special Requests BOTTLES DRAWN AEROBIC AND ANAEROBIC BCAV  Final   Culture   Final    NO GROWTH 5 DAYS Performed at [facility], [address], [place], [facility] [address]    Report Status 03/23/2020 FINAL  Final  MRSA PCR Screening     Status: Abnormal   Collection Time: 03/19/20  5:13  AM   Specimen: Nasopharyngeal  Result Value Ref Range Status   MRSA by PCR POSITIVE (A) NEGATIVE Corrected    Comment:        The GeneXpert MRSA Assay (FDA approved for NASAL specimens only), is one component of a comprehensive MRSA colonization surveillance program. It is not intended to diagnose MRSA infection nor to guide or monitor treatment for MRSA infections. RESULT CALLED TO, READ BACK BY AND VERIFIED WITH: Wynonia Sours RN AT 0715 ON 03/19/20 RH Performed at [facility], [address] [place], [place] [address] CORRECTED ON 08/25 AT 0757: PREVIOUSLY REPORTED AS POSITIVE        The GeneXpert MRSA Assay (FDA approved for NASAL specimens only), is one component of a comprehensive MRSA colonization surveillance program. It is not intended to diagnose MRSA infection  nor to guide or monitor treatment for MRSA  infections. C/ ANNIE SMITH @ 8832 03/19/20 RH         BMP Latest Ref Rng & Units 03/27/2020 03/26/2020 03/25/2020  Glucose 70 - 99 mg/dL 549(I) 264(B) 583(E)  BUN 6 - 20 mg/dL 94(M) 76(K) 08(U)  Creatinine 0.61 - 1.24 mg/dL 1.10 3.15 9.45(O)  Sodium 135 - 145 mmol/L 141 139 139  Potassium 3.5 - 5.1 mmol/L 5.4(H) 5.3(H) 5.4(H)  Chloride 98 - 111 mmol/L 101 103 104  CO2 22 - 32 mmol/L 33(H) 29 28  Calcium 8.9 - 10.3 mg/dL 8.3(L) 8.0(L) 7.9(L)          Indwelling Urinary Catheter continued, requirement due to   Reason to continue Indwelling Urinary Catheter strict Intake/Output monitoring for hemodynamic instability      ASSESSMENT AND PLAN  Acute hypoxic respiratory failure secondary to COVID-19 pneumonia, ARDS Acute COVID19 pneumonia -Remdesevir antiviral - pharmacy protocol 5 d -vitamin C -zinc -decadron 6mg  IV daily  -Diuresis - Lasix 40 IV daily - monitor UOP - utilize external urinary catheter if possible -Self prone if patient can tolerate  -encourage to use IS and Acapella device for bronchopulmonary hygiene when able -consider Actemra if CRP increments >20 -d/c hepatotoxic medications while on remdesevir -supportive care with ICU telemetry monitoring -PT/OT when possible -procalcitonin, CRP and ferritin trending -baricitinib   Hyperglycemia Start SSI coverage   AKI   D/c nonessential nephrotoxins - asprin, zinc, barctinib     ELECTROLYTES -follow labs as needed -replace as needed -pharmacy consultation and following   DVT/GI PRX ordered and assessed-on Lovenox TRANSFUSIONS AS NEEDED MONITOR FSBS I Assessed the need for Labs I Assessed the need for Foley I Assessed the need for Central Venous Line Family Discussion when available I Assessed the need for Mobilization I made an Assessment of medications to be adjusted accordingly Safety Risk assessment completed   CASE DISCUSSED IN MULTIDISCIPLINARY ROUNDS WITH ICU TEAM  The patient is  critically ill with multiple organ system failure and requires high complexity decision making for assessment and support, frequent evaluation and titration of therapies, advanced monitoring, review of radiographic studies and interpretation of complex data.   Critical Care Time devoted to patient care services, exclusive of separately billable procedures, described in this note is 35 minutes.    , M.D.  Pulmonary & Critical Care Medicine  [facility]

## 2020-03-27 NOTE — Progress Notes (Signed)
After patient received bath, he had labored breathing and desatting to 83. Fentanyl bolus given by Sonda Rumble, NP and oxygen on ventilator increased to 85%. Pt remained sedated and stable on ventilator after.

## 2020-03-27 NOTE — Progress Notes (Signed)
*  PRELIMINARY RESULTS* Echocardiogram 2D Echocardiogram has been performed.  Donald Mccall 03/27/2020, 12:21 PM

## 2020-03-27 DEATH — deceased

## 2020-03-28 ENCOUNTER — Inpatient Hospital Stay: Payer: HRSA Program

## 2020-03-28 LAB — BLOOD GAS, ARTERIAL
Acid-Base Excess: 11.6 mmol/L — ABNORMAL HIGH (ref 0.0–2.0)
Bicarbonate: 41.4 mmol/L — ABNORMAL HIGH (ref 20.0–28.0)
FIO2: 0.85
O2 Saturation: 92 %
PEEP: 12 cmH2O
Patient temperature: 37.6
RATE: 28 resp/min
pCO2 arterial: 77 mmHg (ref 32.0–48.0)
pH, Arterial: 7.34 — ABNORMAL LOW (ref 7.350–7.450)
pO2, Arterial: 70 mmHg — ABNORMAL LOW (ref 83.0–108.0)

## 2020-03-28 LAB — BASIC METABOLIC PANEL
Anion gap: 7 (ref 5–15)
BUN: 36 mg/dL — ABNORMAL HIGH (ref 6–20)
CO2: 36 mmol/L — ABNORMAL HIGH (ref 22–32)
Calcium: 8.4 mg/dL — ABNORMAL LOW (ref 8.9–10.3)
Chloride: 99 mmol/L (ref 98–111)
Creatinine, Ser: 0.77 mg/dL (ref 0.61–1.24)
GFR calc Af Amer: >60 mL/min (ref >60)
GFR calc non Af Amer: >60 mL/min (ref >60)
Glucose, Bld: 233 mg/dL — ABNORMAL HIGH (ref 70–99)
Potassium: 4.9 mmol/L (ref 3.5–5.1)
Sodium: 142 mmol/L (ref 135–145)

## 2020-03-28 LAB — CBC
HCT: 39.1 % (ref 39.0–52.0)
Hemoglobin: 12.6 g/dL — ABNORMAL LOW (ref 13.0–17.0)
MCH: 31 pg (ref 26.0–34.0)
MCHC: 32.2 g/dL (ref 30.0–36.0)
MCV: 96.1 fL (ref 80.0–100.0)
Platelets: 237 10*3/uL (ref 150–400)
RBC: 4.07 MIL/uL — ABNORMAL LOW (ref 4.22–5.81)
RDW: 13.3 % (ref 11.5–15.5)
WBC: 21 10*3/uL — ABNORMAL HIGH (ref 4.0–10.5)
nRBC: 0 % (ref 0.0–0.2)

## 2020-03-28 LAB — GLUCOSE, CAPILLARY
Glucose-Capillary: 192 mg/dL — ABNORMAL HIGH (ref 70–99)
Glucose-Capillary: 212 mg/dL — ABNORMAL HIGH (ref 70–99)
Glucose-Capillary: 206 mg/dL — ABNORMAL HIGH (ref 70–99)
Glucose-Capillary: 198 mg/dL — ABNORMAL HIGH (ref 70–99)
Glucose-Capillary: 170 mg/dL — ABNORMAL HIGH (ref 70–99)
Glucose-Capillary: 233 mg/dL — ABNORMAL HIGH (ref 70–99)

## 2020-03-28 LAB — MAGNESIUM: Magnesium: 2.5 mg/dL — ABNORMAL HIGH (ref 1.7–2.4)

## 2020-03-28 LAB — PROCALCITONIN: Procalcitonin: 3.66 ng/mL

## 2020-03-28 LAB — PHOSPHORUS: Phosphorus: 1.5 mg/dL — ABNORMAL LOW (ref 2.5–4.6)

## 2020-03-28 MED ORDER — AMIODARONE IV BOLUS ONLY 150 MG/100ML
150.0000 mg | Freq: Once | INTRAVENOUS | Status: AC
  Administered 2020-03-28: 150 mg via INTRAVENOUS

## 2020-03-28 MED ORDER — SODIUM PHOSPHATES 45 MMOLE/15ML IV SOLN
20.0000 mmol | Freq: Once | INTRAVENOUS | Status: AC
  Administered 2020-03-28: 20 mmol via INTRAVENOUS
  Filled 2020-03-28: qty 6.67

## 2020-03-28 MED ORDER — VECURONIUM BROMIDE 10 MG IV SOLR
10.0000 mg | INTRAVENOUS | Status: DC | PRN
  Administered 2020-03-28 – 2020-03-29 (×3): 10 mg via INTRAVENOUS
  Filled 2020-03-28 (×3): qty 10

## 2020-03-28 MED ORDER — AMIODARONE IV BOLUS ONLY 150 MG/100ML
INTRAVENOUS | Status: AC
  Filled 2020-03-28: qty 100

## 2020-03-28 MED ORDER — ACETAMINOPHEN 650 MG RE SUPP
325.0000 mg | Freq: Once | RECTAL | Status: AC
  Administered 2020-03-28: 325 mg via RECTAL

## 2020-03-28 MED ORDER — METOPROLOL TARTRATE 5 MG/5ML IV SOLN
2.5000 mg | Freq: Four times a day (QID) | INTRAVENOUS | Status: DC | PRN
  Administered 2020-03-28: 5 mg via INTRAVENOUS
  Filled 2020-03-28: qty 5

## 2020-03-28 MED ORDER — STERILE WATER FOR INJECTION IJ SOLN
INTRAMUSCULAR | Status: AC
  Administered 2020-03-28: 10 mL
  Filled 2020-03-28: qty 10

## 2020-03-28 MED ORDER — MAGNESIUM SULFATE 4 GM/100ML IV SOLN
4.0000 g | Freq: Once | INTRAVENOUS | Status: AC
  Administered 2020-03-28: 4 g via INTRAVENOUS
  Filled 2020-03-28: qty 100

## 2020-03-28 MED ORDER — METOPROLOL TARTRATE 5 MG/5ML IV SOLN
2.5000 mg | Freq: Once | INTRAVENOUS | Status: AC
  Administered 2020-03-28: 2.5 mg via INTRAVENOUS

## 2020-03-28 MED ORDER — METOPROLOL TARTRATE 25 MG PO TABS
12.5000 mg | ORAL_TABLET | Freq: Two times a day (BID) | ORAL | Status: DC
  Administered 2020-03-28: 12.5 mg via ORAL
  Filled 2020-03-28: qty 1

## 2020-03-28 MED ORDER — INSULIN ASPART 100 UNIT/ML ~~LOC~~ SOLN
2.0000 [IU] | SUBCUTANEOUS | Status: DC
  Administered 2020-03-28 – 2020-03-30 (×16): 2 [IU] via SUBCUTANEOUS
  Filled 2020-03-28 (×13): qty 1

## 2020-03-28 MED ORDER — METOPROLOL TARTRATE 5 MG/5ML IV SOLN
2.5000 mg | Freq: Once | INTRAVENOUS | Status: AC
  Administered 2020-03-28: 2.5 mg via INTRAVENOUS
  Filled 2020-03-28: qty 5

## 2020-03-28 MED ORDER — SULFAMETHOXAZOLE-TRIMETHOPRIM 200-40 MG/5ML PO SUSP
20.0000 mL | Freq: Two times a day (BID) | ORAL | Status: DC
  Administered 2020-03-28 – 2020-03-29 (×3): 20 mL
  Filled 2020-03-28 (×4): qty 20

## 2020-03-28 MED ORDER — METOPROLOL TARTRATE 5 MG/5ML IV SOLN
INTRAVENOUS | Status: AC
  Filled 2020-03-28: qty 5

## 2020-03-28 MED ORDER — AMIODARONE HCL IN DEXTROSE 360-4.14 MG/200ML-% IV SOLN
INTRAVENOUS | Status: AC
  Filled 2020-03-28: qty 200

## 2020-03-28 MED ORDER — SENNOSIDES 8.8 MG/5ML PO SYRP
10.0000 mL | ORAL_SOLUTION | Freq: Two times a day (BID) | ORAL | Status: DC | PRN
  Administered 2020-03-28 – 2020-03-29 (×2): 10 mL via ORAL
  Filled 2020-03-28 (×3): qty 10

## 2020-03-28 MED ORDER — METOPROLOL TARTRATE 25 MG PO TABS: 25.0000 mg | ORAL_TABLET | Freq: Two times a day (BID) | ORAL | Status: DC

## 2020-03-28 MED ORDER — ACETAMINOPHEN 650 MG RE SUPP: 325.0000 mg | Freq: Once | RECTAL | Status: DC

## 2020-03-28 NOTE — Progress Notes (Signed)
CRITICAL CARE NOTE 8/23 admitted for COVID 19 pneumonia 8/25 remains on biPAP, high risk for intubation 8/26 remains hypoxic, high risk for intubation 8/28- patient took off his HFNC and got out of bed then collapsed on floor unresponsive and became cyanotic and severely hypoxemic.  I picked him up off the floor with help of [facility], intubated and placed central line , reviewed events with family.  Patient with severe ARDS critically ill.  8/30- patient remains on MV, weaned FIO2 from 100>>75, critically ill with ARDS 03/26/20- patient weaned to 65%-FiO2. Spoke to Son [facility] [place] [place]) with interpreter service.  9/1- Patient is still on 65%, severe ards 03/28/20- spoke with family via translator, patient on vent with ARDS pna COVID  CC  follow up respiratory failure  SUBJECTIVE Remains on high flow nasal cannula, guarded prognosis   BP 119/67   Pulse (!) 136   Temp (!) 101.5 F (38.6 C)   Resp (!) 26   Ht 5\' 8"  (1.727 m)   Wt 85.2 kg   SpO2 90%   BMI 28.56 kg/m    I/O last 3 completed shifts: In: 4211.7 [I.V.:3175.4; Other:45; NG/GT:991.3] Out: 2425 [Urine:2375; Emesis/NG output:50] Total I/O In: 2055.1 [I.V.:924.5; NG/GT:935; IV Piggyback:195.6] Out: 1350 [Urine:1350]  SpO2: 90 % O2 Flow Rate (L/min): 45 L/min FiO2 (%): 85 %  Estimated body mass index is 28.56 kg/m as calculated from the following:   Height as of this encounter: 5\' 8"  (1.727 m).   Weight as of this encounter: 85.2 kg.    REVIEW OF SYSTEMS  PATIENT IS UNABLE TO PROVIDE COMPLETE REVIEW OF SYSTEMS DUE TO SEVERE CRITICAL ILLNESS    PHYSICAL EXAMINATION: Blood pressure 125/84, pulse (!) 123, temperature 97.8 F (36.6 C), resp. rate (!) 25, height 5\' 8"  (1.727 m), weight 93.2 kg, SpO2 92 %. Gen:      No acute distress HEENT:  EOMI, sclera anicteric Neck:     No masses; no thyromegaly Lungs:    Scattered crackles CV:         Regular rate and rhythm; no murmurs Abd:      + bowel sounds; soft,  non-tender; no palpable masses, no distension Ext:    No edema; adequate peripheral perfusion Skin:      Warm and dry; no rash3 Neurology - GCS3T     MEDICATIONS: I have reviewed all medications and confirmed regimen as documented   CULTURE RESULTS   Recent Results (from the past 240 hour(s))  Blood Culture (routine x 2)     Status: None   Collection Time: 04-12-20 10:04 PM   Specimen: BLOOD  Result Value Ref Range Status   Specimen Description BLOOD RAC  Final   Special Requests BOTTLES DRAWN AEROBIC AND ANAEROBIC BCHV  Final   Culture   Final    NO GROWTH 5 DAYS Performed at [facility], [address], [place], [facility] [address]    Report Status 03/23/2020 FINAL  Final  Blood Culture (routine x 2)     Status: None   Collection Time: 04/12/2020 10:05 PM   Specimen: BLOOD  Result Value Ref Range Status   Specimen Description BLOOD RAC  Final   Special Requests BOTTLES DRAWN AEROBIC AND ANAEROBIC BCAV  Final   Culture   Final    NO GROWTH 5 DAYS Performed at [facility], [address] [place], [facility] [address]    Report Status 03/23/2020 FINAL  Final  MRSA PCR Screening  Status: Abnormal   Collection Time: 03/19/20  5:13 AM   Specimen: Nasopharyngeal  Result Value Ref Range Status   MRSA by PCR POSITIVE (A) NEGATIVE Corrected    Comment:        The GeneXpert MRSA Assay (FDA approved for NASAL specimens only), is one component of a comprehensive MRSA colonization surveillance program. It is not intended to diagnose MRSA infection nor to guide or monitor treatment for MRSA infections. RESULT CALLED TO, READ BACK BY AND VERIFIED WITH: Wynonia Sours RN AT 0715 ON 03/19/20 RH Performed at [facility], [address] [place], [place] [address] CORRECTED ON 08/25 AT 0757: PREVIOUSLY REPORTED AS POSITIVE        The GeneXpert MRSA Assay (FDA approved for NASAL specimens only), is one component of a comprehensive MRSA colonization  surveillance program. It is not intended to diagnose MRSA infection  nor to guide or monitor treatment for MRSA infections. C/ ANNIE SMITH @ 6606 03/19/20 RH         BMP Latest Ref Rng & Units 03/28/2020 03/27/2020 03/26/2020  Glucose 70 - 99 mg/dL 301(S) 010(X) 323(F)  BUN 6 - 20 mg/dL 57(D) 22(G) 25(K)  Creatinine 0.61 - 1.24 mg/dL 2.70 6.23 7.62  Sodium 135 - 145 mmol/L 142 141 139  Potassium 3.5 - 5.1 mmol/L 4.9 5.4(H) 5.3(H)  Chloride 98 - 111 mmol/L 99 101 103  CO2 22 - 32 mmol/L 36(H) 33(H) 29  Calcium 8.9 - 10.3 mg/dL 8.3(T) 8.3(L) 8.0(L)          Indwelling Urinary Catheter continued, requirement due to   Reason to continue Indwelling Urinary Catheter strict Intake/Output monitoring for hemodynamic instability      ASSESSMENT AND PLAN  Acute hypoxic respiratory failure secondary to COVID-19 pneumonia, ARDS Acute COVID19 pneumonia -Remdesevir antiviral - pharmacy protocol 5 d -vitamin C -zinc -decadron 6mg  IV daily  -Diuresis - Lasix 40 IV daily - monitor UOP - utilize external urinary catheter if possible -Self prone if patient can tolerate  -encourage to use IS and Acapella device for bronchopulmonary hygiene when able -consider Actemra if CRP increments >20 -d/c hepatotoxic medications while on remdesevir -supportive care with ICU telemetry monitoring -PT/OT when possible -procalcitonin, CRP and ferritin trending -baricitinib   Hyperglycemia Start SSI coverage   AKI   D/c nonessential nephrotoxins - asprin, zinc, barctinib     ELECTROLYTES -follow labs as needed -replace as needed -pharmacy consultation and following   DVT/GI PRX ordered and assessed-on Lovenox TRANSFUSIONS AS NEEDED MONITOR FSBS I Assessed the need for Labs I Assessed the need for Foley I Assessed the need for Central Venous Line Family Discussion when available I Assessed the need for Mobilization I made an Assessment of medications to be adjusted accordingly Safety Risk  assessment completed   CASE DISCUSSED IN MULTIDISCIPLINARY ROUNDS WITH ICU TEAM  The patient is critically ill with multiple organ system failure and requires high complexity decision making for assessment and support, frequent evaluation and titration of therapies, advanced monitoring, review of radiographic studies and interpretation of complex data.   Critical Care Time devoted to patient care services, exclusive of separately billable procedures, described in this note is 32 minutes.    , M.D.  Pulmonary & Critical Care Medicine  [facility]

## 2020-03-28 NOTE — Consult Note (Signed)
PHARMACY CONSULT NOTE - FOLLOW UP  Pharmacy Consult for Electrolyte Monitoring and Replacement   Recent Labs: Potassium (mmol/L)  Date Value  03/28/2020 4.9   Magnesium (mg/dL)  Date Value  36/64/4034 2.5 (H)   Calcium (mg/dL)  Date Value  74/25/9563 8.4 (L)   Albumin (g/dL)  Date Value  87/56/4332 2.8 (L)   Phosphorus (mg/dL)  Date Value  95/18/8416 1.5 (L)   Sodium (mmol/L)  Date Value  03/28/2020 142   Corrected Ca: 9.4 mg/dL  Assessment: 44 year old male admitted in 8/23 with COVID-19 PNA, required intubation on 8/28 after taking off HFNC and collapsing on floor.  Patient is currently intubated on ventilator support. Bactrim was started today  Goal of Therapy:  Electrolytes WNL  Plan:   20 mmol IV sodium phosphate x 1  Re-check electrolytes in am 9/3  Lowella Bandy ,PharmD Clinical Pharmacist 03/28/2020 8:06 AM

## 2020-03-28 NOTE — TOC Progression Note (Addendum)
Transition of Care [facility]) - Progression Note    Patient Details  Name: Donald Mccall MRN: [id number] Date of Birth: [birth year]  Transition of Care [facility]) CM/SW Contact  [name] Phone Number:  [phone] 03/28/2020, 9:48 AM  Clinical Narrative:     CSW spoke with patient's wife Donald Mccall [phone].  Ms. Sherlon Handing spoke about her financial concerns.  This CSW referred the Ms. Princella Ion to DSS to obtain information on applying for emergency assistance for help with bills.  Ms. Jamas Lav also inquired about receiving a letter from Attending stating the patient is admitted to the hospital.  CSW relayed a verbal and chat request to the Attending about the letter.       Expected Discharge Plan and Services                                                 Social Determinants of Health (SDOH) Interventions    Readmission Risk Interventions No flowsheet data found.

## 2020-03-28 NOTE — TOC Progression Note (Signed)
Transition of Care [facility]) - Progression Note    Patient Details  Name: Donald Mccall MRN: [id number] Date of Birth: August 11, 1975  Transition of Care [facility]) CM/SW Contact  [name] Phone Number:  [phone] 03/28/2020, 3:31 PM  Clinical Narrative:     CSW updated patient's spouse, Donald Mccall with states update.  CSW spoke with Attending this morning and he requested CSW update family with patient status. CSW explained to Donald Mccall patent status. CSW encouraged Donald Mccall to call ICU if she had any question and for updates.  Donald Mccall verbalized understanding.        Expected Discharge Plan and Services                                                 Social Determinants of Health (SDOH) Interventions    Readmission Risk Interventions No flowsheet data found.

## 2020-03-28 NOTE — Progress Notes (Signed)
Inpatient Diabetes Program Recommendations  AACE/ADA: New Consensus Statement on Inpatient Glycemic Control   Target Ranges:  Prepandial:   less than 140 mg/dL      Peak postprandial:   less than 180 mg/dL (1-2 hours)      Critically ill patients:  140 - 180 mg/dL   Results for [facility] DAIQUAN, RESNIK (MRN [id number]) as of 03/28/2020 11:28  Ref. Range 03/27/2020 07:44 03/27/2020 11:30 03/27/2020 16:03 03/27/2020 19:57 03/27/2020 23:47 03/28/2020 03:49 03/28/2020 07:46 03/28/2020 11:04  Glucose-Capillary Latest Ref Range: 70 - 99 mg/dL 015 (H) 615 (H) 379 (H) 150 (H) 176 (H) 192 (H) 212 (H) 206 (H)   Review of Glycemic Control  Current orders for Inpatient glycemic control: Novolog 0-15 units Q4H; Decadron 6 mg Q24H, Vital @ 45 ml/hr  Inpatient Diabetes Program Recommendations:    Insulin-Tube Feeding Coverage:  Please consider ordering Novolog 2 units Q4H for tube feeding coverage. If tube feeding is stopped or held then Novolog tube feeding coverage should also be stopped or held.  Thanks, Orlando Penner, RN, MSN, CDE Diabetes Coordinator Inpatient Diabetes Program [phone] (Team Pager from 8am to 5pm)

## 2020-03-28 NOTE — Care Plan (Signed)
  March 28, 2020  Patient: Donald Mccall  Date of Birth: 1976/04/07  Date of Visit: 03/25/2020    To Whom It May Concern:    [name] was seen and treated at [facility]  Since  03/01/2020 and is still being treated.     Sincerely,      Vida Rigger, M.D.  Pulmonary & Critical Care Medicine  [facility]

## 2020-03-29 ENCOUNTER — Inpatient Hospital Stay: Payer: HRSA Program

## 2020-03-29 DIAGNOSIS — J1282 Pneumonia due to coronavirus disease 2019: Secondary | ICD-10-CM

## 2020-03-29 DIAGNOSIS — U071 COVID-19: Principal | ICD-10-CM

## 2020-03-29 DIAGNOSIS — J9601 Acute respiratory failure with hypoxia: Secondary | ICD-10-CM

## 2020-03-29 DIAGNOSIS — Z7189 Other specified counseling: Secondary | ICD-10-CM

## 2020-03-29 DIAGNOSIS — Z515 Encounter for palliative care: Secondary | ICD-10-CM

## 2020-03-29 LAB — BLOOD GAS, ARTERIAL
Acid-Base Excess: 9 mmol/L — ABNORMAL HIGH (ref 0.0–2.0)
Acid-Base Excess: 6.3 mmol/L — ABNORMAL HIGH (ref 0.0–2.0)
Allens test (pass/fail): POSITIVE — AB
Bicarbonate: 40.8 mmol/L — ABNORMAL HIGH (ref 20.0–28.0)
Bicarbonate: 41.1 mmol/L — ABNORMAL HIGH (ref 20.0–28.0)
FIO2: 100
FIO2: 1
O2 Saturation: 85.9 %
O2 Saturation: 77 %
PEEP: 12 cmH2O
Patient temperature: 37
Patient temperature: 37
RATE: 26 resp/min
pCO2 arterial: 102 mmHg (ref 32.0–48.0)
pCO2 arterial: 145 mmHg (ref 32.0–48.0)
pH, Arterial: 7.21 — ABNORMAL LOW (ref 7.350–7.450)
pH, Arterial: 7.06 — CL (ref 7.350–7.450)
pO2, Arterial: 62 mmHg — ABNORMAL LOW (ref 83.0–108.0)
pO2, Arterial: 59 mmHg — ABNORMAL LOW (ref 83.0–108.0)

## 2020-03-29 LAB — BLOOD CULTURE ID PANEL (REFLEXED) - BCID2

## 2020-03-29 LAB — CBC
HCT: 41.6 % (ref 39.0–52.0)
Hemoglobin: 12.7 g/dL — ABNORMAL LOW (ref 13.0–17.0)
MCH: 31 pg (ref 26.0–34.0)
MCHC: 30.5 g/dL (ref 30.0–36.0)
MCV: 101.5 fL — ABNORMAL HIGH (ref 80.0–100.0)
Platelets: 267 10*3/uL (ref 150–400)
RBC: 4.1 MIL/uL — ABNORMAL LOW (ref 4.22–5.81)
RDW: 13.9 % (ref 11.5–15.5)
WBC: 19.1 10*3/uL — ABNORMAL HIGH (ref 4.0–10.5)
nRBC: 0.3 % — ABNORMAL HIGH (ref 0.0–0.2)

## 2020-03-29 LAB — BASIC METABOLIC PANEL
Anion gap: 6 (ref 5–15)
Anion gap: 13 (ref 5–15)
BUN: 40 mg/dL — ABNORMAL HIGH (ref 6–20)
BUN: 65 mg/dL — ABNORMAL HIGH (ref 6–20)
CO2: 38 mmol/L — ABNORMAL HIGH (ref 22–32)
CO2: 31 mmol/L (ref 22–32)
Calcium: 8.2 mg/dL — ABNORMAL LOW (ref 8.9–10.3)
Calcium: 7.5 mg/dL — ABNORMAL LOW (ref 8.9–10.3)
Chloride: 100 mmol/L (ref 98–111)
Chloride: 99 mmol/L (ref 98–111)
Creatinine, Ser: 1.14 mg/dL (ref 0.61–1.24)
Creatinine, Ser: 3.36 mg/dL — ABNORMAL HIGH (ref 0.61–1.24)
GFR calc Af Amer: >60 mL/min (ref >60)
GFR calc Af Amer: 25 mL/min — ABNORMAL LOW (ref >60)
GFR calc non Af Amer: >60 mL/min (ref >60)
GFR calc non Af Amer: 21 mL/min — ABNORMAL LOW (ref >60)
Glucose, Bld: 247 mg/dL — ABNORMAL HIGH (ref 70–99)
Glucose, Bld: 177 mg/dL — ABNORMAL HIGH (ref 70–99)
Potassium: 5.3 mmol/L — ABNORMAL HIGH (ref 3.5–5.1)
Potassium: 5.3 mmol/L — ABNORMAL HIGH (ref 3.5–5.1)
Sodium: 144 mmol/L (ref 135–145)
Sodium: 143 mmol/L (ref 135–145)

## 2020-03-29 LAB — GLUCOSE, CAPILLARY
Glucose-Capillary: 137 mg/dL — ABNORMAL HIGH (ref 70–99)
Glucose-Capillary: 153 mg/dL — ABNORMAL HIGH (ref 70–99)
Glucose-Capillary: 167 mg/dL — ABNORMAL HIGH (ref 70–99)
Glucose-Capillary: 184 mg/dL — ABNORMAL HIGH (ref 70–99)
Glucose-Capillary: 211 mg/dL — ABNORMAL HIGH (ref 70–99)
Glucose-Capillary: 241 mg/dL — ABNORMAL HIGH (ref 70–99)

## 2020-03-29 LAB — PHOSPHORUS: Phosphorus: 3.9 mg/dL (ref 2.5–4.6)

## 2020-03-29 LAB — TRIGLYCERIDES: Triglycerides: 414 mg/dL — ABNORMAL HIGH (ref <150)

## 2020-03-29 LAB — PROCALCITONIN: Procalcitonin: 6.75 ng/mL

## 2020-03-29 LAB — T4, FREE: Free T4: 0.9 ng/dL (ref 0.61–1.12)

## 2020-03-29 LAB — MAGNESIUM: Magnesium: 3 mg/dL — ABNORMAL HIGH (ref 1.7–2.4)

## 2020-03-29 LAB — TSH: TSH: 0.054 u[IU]/mL — ABNORMAL LOW (ref 0.350–4.500)

## 2020-03-29 MED ORDER — MIDAZOLAM 50MG/50ML (1MG/ML) PREMIX INFUSION
0.5000 mg/h | INTRAVENOUS | Status: DC
  Administered 2020-03-29: 10 mg/h via INTRAVENOUS
  Administered 2020-03-29: 8 mg/h via INTRAVENOUS
  Administered 2020-03-29: 10 mg/h via INTRAVENOUS
  Administered 2020-03-30 – 2020-03-31 (×5): 8 mg/h via INTRAVENOUS
  Filled 2020-03-29 (×8): qty 50

## 2020-03-29 MED ORDER — ALBUMIN HUMAN 5 % IV SOLN
25.0000 g | Freq: Once | INTRAVENOUS | Status: AC
  Administered 2020-03-29: 25 g via INTRAVENOUS
  Filled 2020-03-29: qty 500

## 2020-03-29 MED ORDER — VASOPRESSIN 20 UNITS/100 ML INFUSION FOR SHOCK
0.0000 [IU]/min | INTRAVENOUS | Status: DC
  Administered 2020-03-29 – 2020-03-30 (×4): 0.03 [IU]/min via INTRAVENOUS
  Filled 2020-03-29 (×6): qty 100

## 2020-03-29 MED ORDER — VANCOMYCIN HCL 1250 MG/250ML IV SOLN
1250.0000 mg | Freq: Two times a day (BID) | INTRAVENOUS | Status: DC
  Filled 2020-03-29 (×2): qty 250

## 2020-03-29 MED ORDER — SODIUM CHLORIDE 0.9 % IV SOLN
2.0000 g | Freq: Three times a day (TID) | INTRAVENOUS | Status: DC
  Administered 2020-03-29 – 2020-03-30 (×2): 2 g via INTRAVENOUS
  Filled 2020-03-29 (×4): qty 2

## 2020-03-29 MED ORDER — PHENYLEPHRINE CONCENTRATED 100MG/250ML (0.4 MG/ML) INFUSION SIMPLE
0.0000 ug/min | INTRAVENOUS | Status: DC
  Administered 2020-03-29 (×3): 400 ug/min via INTRAVENOUS
  Administered 2020-03-29: 75 ug/min via INTRAVENOUS
  Administered 2020-03-30 – 2020-03-31 (×4): 400 ug/min via INTRAVENOUS
  Filled 2020-03-29 (×11): qty 250

## 2020-03-29 MED ORDER — DEXAMETHASONE SODIUM PHOSPHATE 10 MG/ML IJ SOLN
6.0000 mg | Freq: Every day | INTRAMUSCULAR | Status: DC
  Administered 2020-03-29 – 2020-03-30 (×2): 6 mg via INTRAVENOUS
  Filled 2020-03-29 (×3): qty 0.6

## 2020-03-29 MED ORDER — DEXTROSE 50 % IV SOLN
12.5000 g | Freq: Once | INTRAVENOUS | Status: AC
  Administered 2020-03-29: 12.5 g via INTRAVENOUS
  Filled 2020-03-29: qty 50

## 2020-03-29 MED ORDER — FUROSEMIDE 10 MG/ML IJ SOLN
40.0000 mg | Freq: Two times a day (BID) | INTRAMUSCULAR | Status: DC
  Administered 2020-03-29 – 2020-03-30 (×3): 40 mg via INTRAVENOUS
  Filled 2020-03-29 (×4): qty 4

## 2020-03-29 MED ORDER — VANCOMYCIN HCL 2000 MG/400ML IV SOLN
2000.0000 mg | Freq: Once | INTRAVENOUS | Status: AC
  Administered 2020-03-29: 2000 mg via INTRAVENOUS
  Filled 2020-03-29: qty 400

## 2020-03-29 MED ORDER — METOPROLOL TARTRATE 25 MG PO TABS
12.5000 mg | ORAL_TABLET | Freq: Once | ORAL | Status: AC
  Administered 2020-03-29: 12.5 mg
  Filled 2020-03-29: qty 1

## 2020-03-29 MED ORDER — KETOROLAC TROMETHAMINE 15 MG/ML IJ SOLN
15.0000 mg | Freq: Once | INTRAMUSCULAR | Status: AC
  Administered 2020-03-29: 15 mg via INTRAVENOUS
  Filled 2020-03-29: qty 1

## 2020-03-29 MED ORDER — ALBUTEROL (5 MG/ML) CONTINUOUS INHALATION SOLN
10.0000 mg/h | INHALATION_SOLUTION | RESPIRATORY_TRACT | Status: AC
  Administered 2020-03-29 (×2): 10 mg/h via RESPIRATORY_TRACT
  Filled 2020-03-29: qty 20

## 2020-03-29 MED ORDER — ESMOLOL HCL-SODIUM CHLORIDE 2000 MG/100ML IV SOLN
25.0000 ug/kg/min | INTRAVENOUS | Status: DC
  Administered 2020-03-29: 50 ug/kg/min via INTRAVENOUS
  Administered 2020-03-29: 75 ug/kg/min via INTRAVENOUS
  Administered 2020-03-29 – 2020-03-30 (×2): 25 ug/kg/min via INTRAVENOUS
  Filled 2020-03-29 (×4): qty 100

## 2020-03-29 MED ORDER — INSULIN ASPART 100 UNIT/ML IV SOLN
10.0000 [IU] | Freq: Once | INTRAVENOUS | Status: AC
  Administered 2020-03-29: 10 [IU] via INTRAVENOUS
  Filled 2020-03-29: qty 0.1

## 2020-03-29 NOTE — Progress Notes (Signed)
Per Dr. Earlie Server and Seward Grater, NP, patient is to remain proned for now. Patient is too unstable to be turned back to supine. Will continue to monitor.  Carmel Sacramento, RN

## 2020-03-29 NOTE — Progress Notes (Signed)
Only 30CC urine out of foley this shift. Foley flushed and has return. Pt is proned, unable to bladder scan at this time. Unstable, 83% Oxygen sats while proned. BMP ordered.

## 2020-03-29 NOTE — Progress Notes (Addendum)
RN spoke to pt son Elita Quick over the phone with interpretor. RN went over POC and pt current assessment. Per son continue full treatment and full code. RN did inform son that pt wife is ultimately Management consultant. Pt currently full code. Provided emotional support. MD made aware, will continue to monitor.

## 2020-03-29 NOTE — Progress Notes (Signed)
Nutrition Follow-up  DOCUMENTATION CODES:   Not applicable  INTERVENTION:  If patient stabilizes and tube feeds able to be resumed recommend initiating Vital AF 1.2 Cal at 20 mL/hr and advancing by 15 mL/hr every 8 hours to goal rate of 65 mL/hr (1560 mL goal daily volume) + PROSource 45 mL once daily per tube. Provides 1912 kcal, 128 grams of protein, 1264 mL H2O daily.  NUTRITION DIAGNOSIS:   Inadequate oral intake related to inability to eat as evidenced by NPO status.  Ongoing.  GOAL:   Patient will meet greater than or equal to 90% of their needs  Met with TF regimen.  MONITOR:   Vent status, Labs, Weight trends, TF tolerance, I & O's  REASON FOR ASSESSMENT:   Ventilator    ASSESSMENT:   44 year old male admitted in 8/23 with COVID-19 PNA, required intubation on 8/28 after taking off HFNC and collapsing on floor.  Patient is currently intubated on ventilator support MV: 12.6 L/min Temp (24hrs), Avg:101.1 F (38.4 C), Min:98.9 F (37.2 C), Max:103.4 F (39.7 C)  Medications reviewed and include: vitamin C 500 mg daily, Decadron 6 mg daily IV, Lasix 40 mg Q12hrs IV, Novolog 0-15 units Q4hrs, Novolog 2 units Q4hrs, MVI daily per tube, Protonix, Precedex gtt, esmolol, fentanyl gtt, Versed gtt, phenylephrine gtt at 400 mcg/min, propofol gtt, vasopressin gtt at 0.03 units/min.  Labs reviewed: CBG 167-211, Potassium 5.3, CO2 38, BUN 40.  I/O: 2275 mL UOP yesterday (1.1 mL/kg/hr)  Weight trend: 88 kg on 9/3; -4.8 kg from 8/24  Enteral Access: OGT placed 8/28; per chest x-ray today tube enters stomach with tip off field of view  TF regimen: Vital High Protein at 45 mL/hr + PROSource 45 mL TID per tube  Discussed with RN and on rounds. Propofol gtt being discontinued today. Plan is to hold tube feeds today as patient is on increasing pressor dose.  Diet Order:   Diet Order    None     EDUCATION NEEDS:   No education needs have been identified at this  time  Skin:  Skin Assessment: Reviewed RN Assessment  Last BM:  03/21/2020  Height:   Ht Readings from Last 1 Encounters:  03/19/20 '5\' 8"'  (1.727 m)   Weight:   Wt Readings from Last 1 Encounters:  03/29/20 88 kg   Ideal Body Weight:  70 kg  BMI:  Body mass index is 29.5 kg/m.  Estimated Nutritional Needs:   Kcal:  2011  Protein:  130-140 grams  Fluid:  >/= 2 L/day  Jacklynn Barnacle, MS, RD, LDN Pager number available on Amion

## 2020-03-29 NOTE — Progress Notes (Signed)
Assisted with repositioning patients head from left to right with no complications.

## 2020-03-29 NOTE — Consult Note (Signed)
Pharmacy Antibiotic Note  Donald Mccall is a 44 y.o. male admitted on 03/07/2020 with pneumonia.  Pharmacy has been consulted for vancomycin and cefepime dosing.  Today, 03/29/20  --Procalcitonin trending up, patient is persistently febrile, WBC elevated but stable --Bactrim d/c given increasing Scr and potassium  Plan: Cefepime 2 g IV q8h  Vancomycin 2 g IV LD x 1 followed by maintenance regimen of vancomycin 1250 mg q12h  Daily Scr per protocol while on vancomycin. Continue to follow cultures. Pharmacy will continue to monitor and adjust antibiotics as indicated per renal function.   Height: 5\' 8"  (172.7 cm) Weight: 88 kg (194 lb 0.1 oz) IBW/kg (Calculated) : 68.4  Temp (24hrs), Avg:100.6 F (38.1 C), Min:95.8 F (35.4 C), Max:103.4 F (39.7 C)  Recent Labs  Lab 03/23/20 0443 03/25/20 0500 03/25/20 0544 03/26/20 0520 03/27/20 0440 03/28/20 0410 03/29/20 0331  WBC  --  15.6*  --  18.4* 23.1* 21.0* 19.1*  CREATININE   < >  --  1.52* 0.88 0.89 0.77 1.14   < > = values in this interval not displayed.    Estimated Creatinine Clearance: 90.1 mL/min (by C-G formula based on SCr of 1.14 mg/dL).    No Known Allergies  Antimicrobials this admission: Remdesivir 8/23 >> 8/27 Azithromycin 8/24 >> 8/25 Ceftriaxone 8/24 >> 8/25 Bactrim 9/2 >> 9/3 Cefepime 9/3 >>  Vancomycin 9/3 >>   Dose adjustments this admission: n/a  Microbiology results: 9/3 Sputum: few GPC, rare GNR, rare gram variable rod, rare yeast  9/3 BCx: pending 8/23 BCx: NG x 5 days 8/24 MRSA PCR: positive  Thank you for allowing pharmacy to be a part of this patient's care.  9/24 03/29/2020 5:51 PM

## 2020-03-29 NOTE — Progress Notes (Signed)
Patient's HR remains elevated between 135-155 despite multiple doses of IV and PO (per tube) metoprolol. Additionally, patient continues to have an elevated temps despite PO (per tube) and rectal tylenol doses. Patient placed on a cooling blanket to assist with decreasing temp (current temp as of 0145 is 101.9 via esophageal probe). Will give more PRN doses when available. Patient remains critically ill. Will continue to monitor.  Carmel Sacramento, RN

## 2020-03-29 NOTE — Progress Notes (Signed)
CRITICAL CARE NOTE 8/23 admitted for COVID 19 pneumonia 8/25 remains on biPAP, high risk for intubation 8/26 remains hypoxic, high risk for intubation 8/28- patient took off his HFNC and got out of bed then collapsed on floor unresponsive and became cyanotic and severely hypoxemic.  I picked him up off the floor with help of [facility], intubated and placed central line , reviewed events with family.  Patient with severe ARDS critically ill.  8/30- patient remains on MV, weaned FIO2 from 100>>75, critically ill with ARDS 03/26/20- patient weaned to 65%-FiO2. Spoke to Son [facility] [place]) with interpreter service.  9/1- Patient is still on 65%, severe ards 03/28/20- spoke with family via translator, patient on vent with ARDS pna COVID 03/29/20- pCO2 remains > 100 in prone position, sat 88% FiO2 100%  CC  follow up respiratory failure  SUBJECTIVE Intubated    BP (!) 91/49   Pulse (!) 135   Temp (!) 103.4 F (39.7 C) (Esophageal)   Resp 15   Ht 5\' 8"  (1.727 m)   Wt 88 kg   SpO2 (!) 87%   BMI 29.50 kg/m    I/O last 3 completed shifts: In: 6068.8 [I.V.:3608.1; NG/GT:2265.1; IV Piggyback:195.6] Out: 2925 [Urine:2925] No intake/output data recorded.  SpO2: (!) 87 % O2 Flow Rate (L/min): 45 L/min FiO2 (%): 100 %  Estimated body mass index is 29.5 kg/m as calculated from the following:   Height as of this encounter: 5\' 8"  (1.727 m).   Weight as of this encounter: 88 kg.      PHYSICAL EXAMINATION: Blood pressure 125/84, pulse (!) 123, temperature 97.8 F (36.6 C), resp. rate (!) 25, height 5\' 8"  (1.727 m), weight 93.2 kg, SpO2 92 %. Gen:      Prone, ill appearing  HEENT:  ETT, EOMI, sclera anicteric Neck:     No masses; no thyromegaly Lungs:    Distant, no rales CV:         Regular rate and rhythm; no murmurs Abd:      prone Ext:    trace edema; adequate peripheral perfusion Skin:      Warm and dry; no rash3 Neurology - GCS3T     MEDICATIONS: I have reviewed all  medications and confirmed regimen as documented   CULTURE RESULTS   No results found for this or any previous visit (from the past 240 hour(s)).      BMP Latest Ref Rng & Units 03/29/2020 03/28/2020 03/27/2020  Glucose 70 - 99 mg/dL 05/29/2020) 05/28/2020) 05/27/2020)  BUN 6 - 20 mg/dL 353(I) 144(R) 154(M)  Creatinine 0.61 - 1.24 mg/dL 08(Q 76(P 95(K  Sodium 135 - 145 mmol/L 144 142 141  Potassium 3.5 - 5.1 mmol/L 5.3(H) 4.9 5.4(H)  Chloride 98 - 111 mmol/L 100 99 101  CO2 22 - 32 mmol/L 38(H) 36(H) 33(H)  Calcium 8.9 - 10.3 mg/dL 8.2(L) 8.4(L) 8.3(L)          Indwelling Urinary Catheter continued, requirement due to   Reason to continue Indwelling Urinary Catheter strict Intake/Output monitoring for hemodynamic instability      ASSESSMENT AND PLAN  Acute hypoxic respiratory failure secondary to COVID-19 pneumonia, ARDS Acute COVID19 pneumonia -Remdesevir completed -vitamin C -zinc -resume decadron 6mg  IV daily  -now 3L net positive -Increase diuresis as allowed by renal indices for net even goal  -Remain prone today -Assess C-RP today and perhaps consider baricitinib -maintain typical anticoagulant/DVT prevention  Hyperglycemia Start SSI coverage   AKI Trend renal indices  Attempt  volume reduction to even as tolerated     ELECTROLYTES -follow labs as needed -replace as needed -pharmacy consultation and following   DVT/GI PRX ordered and assessed-on Lovenox TRANSFUSIONS AS NEEDED MONITOR FSBS I Assessed the need for Labs I Assessed the need for Foley I Assessed the need for Central Venous Line Family Discussion when available I Assessed the need for Mobilization I made an Assessment of medications to be adjusted accordingly Safety Risk assessment completed   CASE DISCUSSED IN MULTIDISCIPLINARY ROUNDS WITH ICU TEAM  The patient is critically ill with multiple organ system failure and requires high complexity decision making for assessment and support, frequent  evaluation and titration of therapies, advanced monitoring, review of radiographic studies and interpretation of complex data.   Critical Care Time devoted to patient care services, exclusive of separately billable procedures, described in this note is 35 minutes.    Elyn Aquas, MD

## 2020-03-29 NOTE — Progress Notes (Addendum)
PHARMACY - PHYSICIAN COMMUNICATION CRITICAL VALUE ALERT - BLOOD CULTURE IDENTIFICATION (BCID)  Donald Mccall is an 44 y.o. male who presented to [facility] on 04/15/2020 with a chief complaint of fever  Assessment:  Lab reports 1/4 bottles + for GPC, staph epidermidis, mecA was detected  Name of physician (or Provider) Contacted: M. Tukov-Yual, NP  Current antibiotics: Vancomycin and Cefepime  Changes to prescribed antibiotics recommended: none Patient is on recommended antibiotics - No changes needed  Results for orders placed or performed during the hospital encounter of 04/15/20  Blood Culture ID Panel (Reflexed) (Collected: 03/29/2020  6:38 AM)  Result Value Ref Range   Enterococcus faecalis NOT DETECTED NOT DETECTED   Enterococcus Faecium NOT DETECTED NOT DETECTED   Listeria monocytogenes NOT DETECTED NOT DETECTED   Staphylococcus species DETECTED (A) NOT DETECTED   Staphylococcus aureus (BCID) NOT DETECTED NOT DETECTED   Staphylococcus epidermidis DETECTED (A) NOT DETECTED   Staphylococcus lugdunensis NOT DETECTED NOT DETECTED   Streptococcus species NOT DETECTED NOT DETECTED   Streptococcus agalactiae NOT DETECTED NOT DETECTED   Streptococcus pneumoniae NOT DETECTED NOT DETECTED   Streptococcus pyogenes NOT DETECTED NOT DETECTED   A.calcoaceticus-baumannii NOT DETECTED NOT DETECTED   Bacteroides fragilis NOT DETECTED NOT DETECTED   Enterobacterales NOT DETECTED NOT DETECTED   Enterobacter cloacae complex NOT DETECTED NOT DETECTED   Escherichia coli NOT DETECTED NOT DETECTED   Klebsiella aerogenes NOT DETECTED NOT DETECTED   Klebsiella oxytoca NOT DETECTED NOT DETECTED   Klebsiella pneumoniae NOT DETECTED NOT DETECTED   Proteus species NOT DETECTED NOT DETECTED   Salmonella species NOT DETECTED NOT DETECTED   Serratia marcescens NOT DETECTED NOT DETECTED   Haemophilus influenzae NOT DETECTED NOT DETECTED   Neisseria meningitidis NOT DETECTED NOT DETECTED    Pseudomonas aeruginosa NOT DETECTED NOT DETECTED   Stenotrophomonas maltophilia NOT DETECTED NOT DETECTED   Candida albicans NOT DETECTED NOT DETECTED   Candida auris NOT DETECTED NOT DETECTED   Candida glabrata NOT DETECTED NOT DETECTED   Candida krusei NOT DETECTED NOT DETECTED   Candida parapsilosis NOT DETECTED NOT DETECTED   Candida tropicalis NOT DETECTED NOT DETECTED   Cryptococcus neoformans/gattii NOT DETECTED NOT DETECTED   Methicillin resistance mecA/C DETECTED (A) NOT DETECTED    Valrie Hart A 03/29/2020  11:05 PM

## 2020-03-29 NOTE — Progress Notes (Signed)
Patient proned at 0530 on 03/29/2020 to assist with oxygenation.  Patient placed on an esmolol drip due to increased heart rate. Subsequently, his BP dropped and he required a neo-synephrine drip and a vasopressin drip to stabilize his BP.   Temps are still elevated but are slightly better now that he is proned. Cooling blanket is still applied. Will continue to monitor.  Carmel Sacramento, RN

## 2020-03-29 NOTE — Progress Notes (Signed)
Inpatient Diabetes Program Recommendations  AACE/ADA: New Consensus Statement on Inpatient Glycemic Control (2015)  Target Ranges:  Prepandial:   less than 140 mg/dL      Peak postprandial:   less than 180 mg/dL (1-2 hours)      Critically ill patients:  140 - 180 mg/dL   Results for [facility] ISACK, LAVALLEY (MRN [id number]) as of 03/29/2020 11:38  Ref. Range 03/28/2020 23:26 03/29/2020 03:20 03/29/2020 07:26 03/29/2020 11:30  Glucose-Capillary Latest Ref Range: 70 - 99 mg/dL 142 (H)  7 units NOVOLOG  241 (H)  7 units NOVOLOG   5 units Novolog + 25ml D50% also given at 5:50am  167 (H)  5 units NOVOLOG  211 (H)   Current Insulin Orders: Novolog 0-15 units Q4H          Novolog 2 units Q4 hours for TF Coverage    Decadron 6 mg Q24H  Vital tube feeds @ 45 ml/hr    MD- Please consider increasing the Novolog Tube Feed Coverage to 4 units Q4 hours  HOLD if tube feeds HELD for any reason    --Will follow patient during hospitalization--  Ambrose Finland RN, MSN, CDE Diabetes Coordinator Inpatient Glycemic Control Team Team Pager: [phone] (8a-5p)

## 2020-03-29 NOTE — Consult Note (Signed)
PHARMACY CONSULT NOTE - FOLLOW UP  Pharmacy Consult for Electrolyte Monitoring and Replacement   Recent Labs: Potassium (mmol/L)  Date Value  03/29/2020 5.3 (H)   Magnesium (mg/dL)  Date Value  76/22/6333 3.0 (H)   Calcium (mg/dL)  Date Value  54/56/2563 8.2 (L)   Albumin (g/dL)  Date Value  89/37/3428 2.8 (L)   Phosphorus (mg/dL)  Date Value  76/81/1572 3.9   Sodium (mmol/L)  Date Value  03/29/2020 144   Corrected Ca: 9.4 mg/dL  Assessment: 44 year old male admitted in 8/23 with COVID-19 PNA, required intubation on 8/28 after taking off HFNC and collapsing on floor.  Patient is currently intubated on ventilator support. Bactrim was started 9/2  Renal function worse with associated mild hyperkalemia today since starting Bactrim yesterday. Blood pressure also worse overnight.  Goal of Therapy:  Electrolytes WNL  Plan:   No electrolyte repletion indicated  Re-check electrolytes in AM 9/4  Tressie Ellis Clinical Pharmacist 03/29/2020 7:34 AM

## 2020-03-29 NOTE — Consult Note (Signed)
Consultation Note Date: 03/29/2020   Patient Name: Donald Mccall  DOB: 18-Aug-1975  MRN: [id number]  Age / Sex: 44 y.o., male  PCP: Patient, No Pcp Per Referring Physician: Ottie Glazier, MD  Reason for Consultation: Establishing goals of care  HPI/Patient Profile: 44 y.o. male  With no past medical history admitted on 03/03/2020 with COVID pna. Required intubation 8/28. Remains hypoxic - now in prone position and on 100% FiO2. Requiring pressors. PMT consulted for [place].  Clinical Assessment and Goals of Care: I have reviewed medical records including EPIC notes, labs and imaging, received report from RN, assessed the patient and then spoke with his wife [name] to discuss diagnosis prognosis, GOC, EOL wishes, disposition and options.  Spanish interpreter Mila Merry assisted with conversation.   I introduced Palliative Medicine as specialized medical care for people living with serious illness. It focuses on providing relief from the symptoms and stress of a serious illness. The goal is to improve quality of life for both the patient and the family.   We discussed patient's current illness and concern about poor prognosis. Multiple questions about patient's condition and amount of support he is requiring addressed. Wife expresses understanding about severity of illness and poor prognosis.   I attempted to elicit values and goals of care important to the patient.  The difference between aggressive medical intervention and comfort care was considered in light of the patient's goals of care.   Wife shares about her faith - she expresses hope that God will heal patient.   Encouraged wife to consider DNR status. This was discussed in depth and wife seemed to agree DNR appropriate but ultimately asked for more time to consider and discuss with her sons. She understands patient is very ill and could decompensate at any moment.   Discussed with wife  the importance of continued conversation with family and the medical providers regarding overall plan of care and treatment options, ensuring decisions are within the context of the patient's values and GOCs.    Questions and concerns were addressed. The family was encouraged to call with questions or concerns.   Primary Decision Maker NEXT OF KIN - wife [name]    SUMMARY OF RECOMMENDATIONS   - DNR recommended - wife request time to discuss with other family members - wife educated on severity of illness/poor prognosis  Code Status/Advance Care Planning:  Full code  Prognosis:   Unable to determine  Discharge Planning: To Be Determined      Primary Diagnoses: Present on Admission: . Pneumonia due to COVID-19 virus   I have reviewed the medical record, interviewed the patient and family, and examined the patient. The following aspects are pertinent.  History reviewed. No pertinent past medical history. Social History   Socioeconomic History  . Marital status: Married    Spouse name: Not on file  . Number of children: Not on file  . Years of education: Not on file  . Highest education level: Not on file  Occupational History  . Not on file  Tobacco Use  . Smoking status: Never Smoker  . Smokeless tobacco: Never Used  Substance and Sexual Activity  . Alcohol use: Not Currently  . Drug use: Never  . Sexual activity: Not on file  Other Topics Concern  . Not on file  Social History Narrative  . Not on file   Social Determinants of Health   Financial Resource Strain:   . Difficulty of Paying Living Expenses: Not on file  Food Insecurity:   .  Worried About Charity fundraiser in the Last Year: Not on file  . Ran Out of Food in the Last Year: Not on file  Transportation Needs:   . Lack of Transportation (Medical): Not on file  . Lack of Transportation (Non-Medical): Not on file  Physical Activity:   . Days of Exercise per Week: Not on file  . Minutes of  Exercise per Session: Not on file  Stress:   . Feeling of Stress : Not on file  Social Connections:   . Frequency of Communication with Friends and Family: Not on file  . Frequency of Social Gatherings with Friends and Family: Not on file  . Attends Religious Services: Not on file  . Active Member of Clubs or Organizations: Not on file  . Attends Archivist Meetings: Not on file  . Marital Status: Not on file   History reviewed. No pertinent family history. Scheduled Meds: . vitamin C  500 mg Oral Daily  . chlorhexidine gluconate (MEDLINE KIT)  15 mL Mouth Rinse BID  . Chlorhexidine Gluconate Cloth  6 each Topical Daily  . dexamethasone (DECADRON) injection  6 mg Intravenous Daily  . enoxaparin (LOVENOX) injection  40 mg Subcutaneous Q24H  . furosemide  40 mg Intravenous Q12H  . insulin aspart  0-15 Units Subcutaneous Q4H  . insulin aspart  2 Units Subcutaneous Q4H  . mouth rinse  15 mL Mouth Rinse 10 times per day  . multivitamin with minerals  1 tablet Per Tube Daily  . pantoprazole (PROTONIX) IV  40 mg Intravenous QHS   Continuous Infusions: . albuterol    . dexmedetomidine (PRECEDEX) IV infusion 0.6 mcg/kg/hr (03/29/20 1300)  . esmolol 50 mcg/kg/min (03/29/20 1300)  . fentaNYL infusion INTRAVENOUS 300 mcg/hr (03/29/20 1417)  . midazolam 10 mg/hr (03/29/20 1416)  . phenylephrine (NEO-SYNEPHRINE) Adult infusion 400 mcg/min (03/29/20 1300)  . propofol (DIPRIVAN) infusion Stopped (03/29/20 1108)  . vasopressin 0.03 Units/min (03/29/20 1300)   PRN Meds:.acetaminophen, dextromethorphan-guaiFENesin, lip balm, midazolam, ondansetron (ZOFRAN) IV, polyethylene glycol, sennosides, traZODone, vecuronium No Known Allergies Review of Systems  Unable to perform ROS: Intubated    Physical Exam Constitutional:      Comments: Intubated, sedated, in prone position, o2 sats low 80s, unresponsive     Vital Signs: BP (!) 92/56   Pulse (!) 117   Temp 98.9 F (37.2 C)  (Esophageal)   Resp (!) 0   Ht '5\' 8"'  (1.727 m)   Wt 88 kg   SpO2 (!) 83%   BMI 29.50 kg/m  Pain Scale: CPOT   Pain Score: Asleep   SpO2: SpO2: (!) 83 % O2 Device:SpO2: (!) 83 % O2 Flow Rate: .O2 Flow Rate (L/min): 45 L/min  IO: Intake/output summary:   Intake/Output Summary (Last 24 hours) at 03/29/2020 1418 Last data filed at 03/29/2020 1300 Gross per 24 hour  Intake 3748.61 ml  Output 1395 ml  Net 2353.61 ml    LBM: Last BM Date: 03/21/20 Baseline Weight: Weight: 98 kg Most recent weight: Weight: 88 kg     Palliative Assessment/Data: PPS 10%    Time Total: 75 minutes Greater than 50%  of this time was spent counseling and coordinating care related to the above assessment and plan.  Juel Burrow, DNP, AGNP-C Palliative Medicine Team [phone] Pager: [phone]

## 2020-03-29 NOTE — Progress Notes (Signed)
Pt proned sating 83-85% on 100% mechanical ventilation. MD aware.

## 2020-03-30 DIAGNOSIS — J1282 Pneumonia due to coronavirus disease 2019: Secondary | ICD-10-CM

## 2020-03-30 DIAGNOSIS — U071 COVID-19: Principal | ICD-10-CM

## 2020-03-30 DIAGNOSIS — J9601 Acute respiratory failure with hypoxia: Secondary | ICD-10-CM

## 2020-03-30 LAB — GLUCOSE, CAPILLARY
Glucose-Capillary: 110 mg/dL — ABNORMAL HIGH (ref 70–99)
Glucose-Capillary: 133 mg/dL — ABNORMAL HIGH (ref 70–99)
Glucose-Capillary: 137 mg/dL — ABNORMAL HIGH (ref 70–99)
Glucose-Capillary: 140 mg/dL — ABNORMAL HIGH (ref 70–99)
Glucose-Capillary: 153 mg/dL — ABNORMAL HIGH (ref 70–99)
Glucose-Capillary: 93 mg/dL (ref 70–99)
Glucose-Capillary: 99 mg/dL (ref 70–99)

## 2020-03-30 LAB — BASIC METABOLIC PANEL
Anion gap: 17 — ABNORMAL HIGH (ref 5–15)
BUN: 77 mg/dL — ABNORMAL HIGH (ref 6–20)
CO2: 26 mmol/L (ref 22–32)
Calcium: 7.6 mg/dL — ABNORMAL LOW (ref 8.9–10.3)
Chloride: 100 mmol/L (ref 98–111)
Creatinine, Ser: 4.37 mg/dL — ABNORMAL HIGH (ref 0.61–1.24)
GFR calc Af Amer: 18 mL/min — ABNORMAL LOW (ref >60)
GFR calc non Af Amer: 15 mL/min — ABNORMAL LOW (ref >60)
Glucose, Bld: 169 mg/dL — ABNORMAL HIGH (ref 70–99)
Potassium: 5.5 mmol/L — ABNORMAL HIGH (ref 3.5–5.1)
Sodium: 143 mmol/L (ref 135–145)

## 2020-03-30 LAB — BLOOD GAS, ARTERIAL
Acid-base deficit: 1.8 mmol/L (ref 0.0–2.0)
Bicarbonate: 28.5 mmol/L — ABNORMAL HIGH (ref 20.0–28.0)
FIO2: 100
O2 Saturation: 76.2 %
PEEP: 13 cmH2O
Patient temperature: 37
RATE: 28 resp/min
pCO2 arterial: 80 mmHg (ref 32.0–48.0)
pH, Arterial: 7.16 — CL (ref 7.350–7.450)
pO2, Arterial: 53 mmHg — ABNORMAL LOW (ref 83.0–108.0)

## 2020-03-30 LAB — CBC
HCT: 36.6 % — ABNORMAL LOW (ref 39.0–52.0)
Hemoglobin: 10.9 g/dL — ABNORMAL LOW (ref 13.0–17.0)
MCH: 30.5 pg (ref 26.0–34.0)
MCHC: 29.8 g/dL — ABNORMAL LOW (ref 30.0–36.0)
MCV: 102.5 fL — ABNORMAL HIGH (ref 80.0–100.0)
Platelets: 214 10*3/uL (ref 150–400)
RBC: 3.57 MIL/uL — ABNORMAL LOW (ref 4.22–5.81)
RDW: 14.6 % (ref 11.5–15.5)
WBC: 16.3 10*3/uL — ABNORMAL HIGH (ref 4.0–10.5)
nRBC: 0.3 % — ABNORMAL HIGH (ref 0.0–0.2)

## 2020-03-30 LAB — MAGNESIUM: Magnesium: 3.4 mg/dL — ABNORMAL HIGH (ref 1.7–2.4)

## 2020-03-30 LAB — T3, FREE: T3, Free: 1.8 pg/mL — ABNORMAL LOW (ref 2.0–4.4)

## 2020-03-30 LAB — PHOSPHORUS: Phosphorus: 6.7 mg/dL — ABNORMAL HIGH (ref 2.5–4.6)

## 2020-03-30 LAB — PROCALCITONIN: Procalcitonin: 82.51 ng/mL

## 2020-03-30 MED ORDER — VANCOMYCIN VARIABLE DOSE PER UNSTABLE RENAL FUNCTION (PHARMACIST DOSING): Status: DC

## 2020-03-30 MED ORDER — SODIUM CHLORIDE 0.9 % IV SOLN
2.0000 g | Freq: Two times a day (BID) | INTRAVENOUS | Status: DC
  Administered 2020-03-30: 2 g via INTRAVENOUS
  Filled 2020-03-30 (×3): qty 2

## 2020-03-30 MED ORDER — ENOXAPARIN SODIUM 30 MG/0.3ML ~~LOC~~ SOLN
30.0000 mg | SUBCUTANEOUS | Status: DC
  Administered 2020-03-30: 30 mg via SUBCUTANEOUS
  Filled 2020-03-30: qty 0.3

## 2020-03-30 MED ORDER — NOREPINEPHRINE 16 MG/250ML-% IV SOLN
0.0000 ug/min | INTRAVENOUS | Status: DC
  Administered 2020-03-30: 20 ug/min via INTRAVENOUS
  Administered 2020-03-30: 40 ug/min via INTRAVENOUS
  Filled 2020-03-30 (×4): qty 250

## 2020-03-30 NOTE — Progress Notes (Signed)
PCCM   The patient's wife and clergy came to the bedside to attend move to supine. She was made aware of the severity of his hypoxia and the fact that he would likely have a severe decompensation, and perhaps expire. We explained the need to attempt supine position for significant facial pressure changes. She voiced her understanding through the hospital interpreter. Further we discussed DNR, and visitation issues with COVID. She was understanding that Bailen's lungs are failing and that CPR would not improve them, and so she agreed to no code blue. Her pastor discussed these issues with her to verify her understanding, and through the hospital interpreter completed the translation between all parties. She wished to be in hallway near bedside when he was flipped supine.  //Chrishawn Boley

## 2020-03-30 NOTE — Progress Notes (Signed)
Family allowed to stand at the door and see pt before turning him supine. Pt placed in supine position, spo2 dropped to 80% from 83% when he was prone. RN answered all of the families questions and family very appreciative.

## 2020-03-30 NOTE — Progress Notes (Signed)
Assisted with turning patient to supine position from prone position with no complications. ET tube re-taped at 25cm at the lip in the center.

## 2020-03-30 NOTE — Progress Notes (Signed)
CRITICAL CARE NOTE 8/23 admitted for COVID 19 pneumonia 8/25 remains on biPAP, high risk for intubation 8/26 remains hypoxic, high risk for intubation 8/28- patient took off his HFNC and got out of bed then collapsed on floor unresponsive and became cyanotic and severely hypoxemic.  I picked him up off the floor with help of [facility], intubated and placed central line , reviewed events with family.  Patient with severe ARDS critically ill.  8/30- patient remains on MV, weaned FIO2 from 100>>75, critically ill with ARDS 03/26/20- patient weaned to 65%-FiO2. Spoke to [facility] [place]) with interpreter service.  9/1- Patient is still on 65%, severe ards 03/28/20- spoke with family via translator, patient on vent with ARDS pna COVID 03/29/20- pCO2 remains > 100 in prone position, sat 88% FiO2 100%  CC  follow up respiratory failure  SUBJECTIVE Intubated    BP (!) 90/56   Pulse (!) 125   Temp 99.8 F (37.7 C) (Esophageal)   Resp (!) 28   Ht 5\' 8"  (1.727 m)   Wt 97.7 kg   SpO2 (!) 83%   BMI 32.75 kg/m    I/O last 3 completed shifts: In: 6338.2 [I.V.:4284.4; NG/GT:1269.8; IV Piggyback:784] Out: 1055 [Urine:1055] No intake/output data recorded.  SpO2: (!) 83 % O2 Flow Rate (L/min): 45 L/min FiO2 (%): 100 %  Estimated body mass index is 32.75 kg/m as calculated from the following:   Height as of this encounter: 5\' 8"  (1.727 m).   Weight as of this encounter: 97.7 kg.      PHYSICAL EXAMINATION: Blood pressure 125/84, pulse (!) 123, temperature 97.8 F (36.6 C), resp. rate (!) 25, height 5\' 8"  (1.727 m), weight 93.2 kg, SpO2 92 %. Gen:      Prone, ill appearing  HEENT:  ETT, EOMI, sclera anicteric Neck:     No masses; no thyromegaly Lungs:    Distant, no rales CV:         Regular rate and rhythm; no murmurs Abd:      prone Ext:    trace edema; adequate peripheral perfusion Skin:      Warm and dry; no rash3 Neurology - GCS3T     MEDICATIONS: I have reviewed all  medications and confirmed regimen as documented   CULTURE RESULTS   Recent Results (from the past 240 hour(s))  Culture, respiratory (non-expectorated)     Status: None (Preliminary result)   Collection Time: 03/29/20  3:45 AM   Specimen: Tracheal Aspirate; Respiratory  Result Value Ref Range Status   Specimen Description   Final    TRACHEAL ASPIRATE Performed at [facility], [address] [place], [facility] [address]    Special Requests   Final    NONE Performed at [facility], [address] [place], [facility] [address]    Gram Stain   Final    RARE WBC PRESENT,BOTH PMN AND MONONUCLEAR FEW GRAM POSITIVE COCCI RARE GRAM NEGATIVE RODS RARE GRAM VARIABLE ROD RARE YEAST    Culture   Final    CULTURE REINCUBATED FOR BETTER GROWTH Performed at [facility], [address], [place], [facility] [address]    Report Status PENDING  Incomplete  CULTURE, BLOOD (ROUTINE X 2) w Reflex to ID Panel     Status: None (Preliminary result)   Collection Time: 03/29/20  6:38 AM   Specimen: BLOOD  Result Value Ref Range Status   Specimen Description BLOOD RIGHT [facility]  Final   Special Requests  Final    BOTTLES DRAWN AEROBIC AND ANAEROBIC Blood Culture adequate volume   Culture  Setup Time   Final    Organism ID to follow GRAM POSITIVE COCCI AEROBIC BOTTLE ONLY CRITICAL RESULT CALLED TO, READ BACK BY AND VERIFIED WITH: SCOTT HALL 03/29/20 AT 2258 BY ACR Performed at [facility], [address], [place], [place] [address]    Culture [facility] POSITIVE COCCI  Final   Report Status PENDING  Incomplete  CULTURE, BLOOD (ROUTINE X 2) w Reflex to ID Panel     Status: None (Preliminary result)   Collection Time: 03/29/20  6:38 AM   Specimen: BLOOD  Result Value Ref Range Status   Specimen Description BLOOD LEFT HAND  Final   Special Requests   Final    BOTTLES DRAWN AEROBIC AND ANAEROBIC Blood Culture adequate volume   Culture   Final    NO GROWTH < 24  HOURS Performed at [facility], [address] [place], [place] [address]    Report Status PENDING  Incomplete  Blood Culture ID Panel (Reflexed)     Status: Abnormal   Collection Time: 03/29/20  6:38 AM  Result Value Ref Range Status   Enterococcus faecalis NOT DETECTED NOT DETECTED Final   Enterococcus Faecium NOT DETECTED NOT DETECTED Final   Listeria monocytogenes NOT DETECTED NOT DETECTED Final   Staphylococcus species DETECTED (A) NOT DETECTED Final    Comment: CRITICAL RESULT CALLED TO, READ BACK BY AND VERIFIED WITH: SCOTT HALL 03/29/20 AT 2258 BY ACR    Staphylococcus aureus (BCID) NOT DETECTED NOT DETECTED Final   Staphylococcus epidermidis DETECTED (A) NOT DETECTED Final    Comment: Methicillin (oxacillin) resistant coagulase negative staphylococcus. Possible blood culture contaminant (unless isolated from more than one blood culture draw or clinical case suggests pathogenicity). No antibiotic treatment is indicated for blood  culture contaminants. CRITICAL RESULT CALLED TO, READ BACK BY AND VERIFIED WITH: SCOTT HALL 03/29/20 AT 2258 BY ACR    Staphylococcus lugdunensis NOT DETECTED NOT DETECTED Final   Streptococcus species NOT DETECTED NOT DETECTED Final   Streptococcus agalactiae NOT DETECTED NOT DETECTED Final   Streptococcus pneumoniae NOT DETECTED NOT DETECTED Final   Streptococcus pyogenes NOT DETECTED NOT DETECTED Final   A.calcoaceticus-baumannii NOT DETECTED NOT DETECTED Final   Bacteroides fragilis NOT DETECTED NOT DETECTED Final   Enterobacterales NOT DETECTED NOT DETECTED Final   Enterobacter cloacae complex NOT DETECTED NOT DETECTED Final   Escherichia coli NOT DETECTED NOT DETECTED Final   Klebsiella aerogenes NOT DETECTED NOT DETECTED Final   Klebsiella oxytoca NOT DETECTED NOT DETECTED Final   Klebsiella pneumoniae NOT DETECTED NOT DETECTED Final   Proteus species NOT DETECTED NOT DETECTED Final   Salmonella species NOT DETECTED NOT DETECTED  Final   Serratia marcescens NOT DETECTED NOT DETECTED Final   Haemophilus influenzae NOT DETECTED NOT DETECTED Final   Neisseria meningitidis NOT DETECTED NOT DETECTED Final   Pseudomonas aeruginosa NOT DETECTED NOT DETECTED Final   Stenotrophomonas maltophilia NOT DETECTED NOT DETECTED Final   Candida albicans NOT DETECTED NOT DETECTED Final   Candida auris NOT DETECTED NOT DETECTED Final   Candida glabrata NOT DETECTED NOT DETECTED Final   Candida krusei NOT DETECTED NOT DETECTED Final   Candida parapsilosis NOT DETECTED NOT DETECTED Final   Candida tropicalis NOT DETECTED NOT DETECTED Final   Cryptococcus neoformans/gattii NOT DETECTED NOT DETECTED Final   Methicillin resistance mecA/C DETECTED (A) NOT DETECTED Final    Comment: CRITICAL RESULT CALLED TO,  READ BACK BY AND VERIFIED WITH: SCOTT HALL 03/29/20 AT 2258 BY ACR Performed at [facility], [address] [place], [place] [address]         BMP Latest Ref Rng & Units 03/30/2020 03/29/2020 03/29/2020  Glucose 70 - 99 mg/dL 093(G) 182(X) 937(J)  BUN 6 - 20 mg/dL 69(C) 78(L) 38(B)  Creatinine 0.61 - 1.24 mg/dL 0.17(P) 1.02(H) 8.52  Sodium 135 - 145 mmol/L 143 143 144  Potassium 3.5 - 5.1 mmol/L 5.5(H) 5.3(H) 5.3(H)  Chloride 98 - 111 mmol/L 100 99 100  CO2 22 - 32 mmol/L 26 31 38(H)  Calcium 8.9 - 10.3 mg/dL 7.6(L) 7.5(L) 8.2(L)          Indwelling Urinary Catheter continued, requirement due to   Reason to continue Indwelling Urinary Catheter strict Intake/Output monitoring for hemodynamic instability      ASSESSMENT AND PLAN  Acute hypoxic respiratory failure secondary to COVID-19 pneumonia, ARDS Acute COVID19 pneumonia, sats remain low 80s -Remdesevir completed -vitamin C -zinc -resume decadron 6mg  IV daily  -now 3L net positive -Increase diuresis as allowed by renal indices for net even goal  -Remain prone today -Assess C-RP today and perhaps consider baricitinib -maintain typical anticoagulant/DVT  prevention -The patient is failing. Will attempt to supine today for skin breakdown.  Hyperglycemia Start SSI coverage   AKI Trend renal indices  Attempt volume reduction to even as tolerated     ELECTROLYTES -follow labs as needed -replace as needed -pharmacy consultation and following   DVT/GI PRX ordered and assessed-on Lovenox TRANSFUSIONS AS NEEDED MONITOR FSBS I Assessed the need for Labs I Assessed the need for Foley I Assessed the need for Central Venous Line Family Discussion when available I Assessed the need for Mobilization I made an Assessment of medications to be adjusted accordingly Safety Risk assessment completed   CASE DISCUSSED WITH WIFE  The patient is critically ill with multiple organ system failure and requires high complexity decision making for assessment and support, frequent evaluation and titration of therapies, advanced monitoring, review of radiographic studies and interpretation of complex data.   Critical Care Time devoted to patient care services, exclusive of separately billable procedures, described in this note is 35 minutes.    , MD

## 2020-03-30 NOTE — Consult Note (Signed)
Pharmacy Antibiotic Note  Donald Mccall is a 44 y.o. male admitted on 03/11/2020 with pneumonia.  Pharmacy has been consulted for vancomycin and cefepime dosing. His renal function has been declining   Plan:  1) adjust cefepime dose to 2 grams IV every 12 hours  2) vancomycin random level in am to assess clearance following loading administered 9/3   Height: 5\' 8"  (172.7 cm) Weight: 97.7 kg (215 lb 6.2 oz) IBW/kg (Calculated) : 68.4  Temp (24hrs), Avg:98.8 F (37.1 C), Min:95.8 F (35.4 C), Max:101.1 F (38.4 C)  Recent Labs  Lab 03/26/20 0520 03/26/20 0520 03/27/20 0440 03/28/20 0410 03/29/20 0331 03/29/20 1808 03/30/20 0344  WBC 18.4*  --  23.1* 21.0* 19.1*  --  16.3*  CREATININE 0.88   < > 0.89 0.77 1.14 3.36* 4.37*   < > = values in this interval not displayed.    Estimated Creatinine Clearance: 24.7 mL/min (A) (by C-G formula based on SCr of 4.37 mg/dL (H)).    No Known Allergies  Antimicrobials this admission: Remdesivir 8/23 >> 8/27 Azithromycin 8/24 >> 8/25 Ceftriaxone 8/24 >> 8/25 Bactrim 9/2 >> 9/3 Cefepime 9/3 >>  Vancomycin 9/3 >>   Microbiology results: 9/3 Sputum: few GPC, rare GNR, rare gram variable rod, rare yeast  9/3 BCx: 1/4 Mec-A/C+ S epidermidis 8/23 BCx: NG x 5 days 8/24 MRSA PCR: positive  Thank you for allowing pharmacy to be a part of this patient's care.  9/24 03/30/2020 8:06 AM

## 2020-03-31 LAB — CULTURE, BLOOD (ROUTINE X 2): Special Requests: ADEQUATE

## 2020-03-31 LAB — GLUCOSE, CAPILLARY
Glucose-Capillary: 65 mg/dL — ABNORMAL LOW (ref 70–99)
Glucose-Capillary: 49 mg/dL — ABNORMAL LOW (ref 70–99)

## 2020-03-31 MED ORDER — DEXTROSE 50 % IV SOLN
12.5000 g | Freq: Once | INTRAVENOUS | Status: AC
  Administered 2020-03-31: 12.5 g via INTRAVENOUS
  Filled 2020-03-31: qty 50

## 2020-03-31 MED ORDER — LACTATED RINGERS IV BOLUS
250.0000 mL | Freq: Once | INTRAVENOUS | Status: AC
  Administered 2020-03-31: 250 mL via INTRAVENOUS

## 2020-03-31 MED ORDER — DEXTROSE 50 % IV SOLN
25.0000 g | Freq: Once | INTRAVENOUS | Status: AC
  Administered 2020-03-31: 25 g via INTRAVENOUS
  Filled 2020-03-31: qty 50

## 2020-04-01 LAB — CULTURE, RESPIRATORY W GRAM STAIN

## 2020-04-01 MED FILL — Phenylephrine HCl IV Soln 10 MG/ML: INTRAVENOUS | Qty: 10 | Status: AC

## 2020-04-01 MED FILL — Sodium Chloride IV Soln 0.9%: INTRAVENOUS | Qty: 250 | Status: AC

## 2020-04-01 MED FILL — Vasopressin IV Soln 20 Unit/ML (For IV Infusion): INTRAVENOUS | Qty: 10 | Status: CN

## 2020-04-02 LAB — GLUCOSE, CAPILLARY: Glucose-Capillary: 39 mg/dL — CL (ref 70–99)

## 2020-04-02 MED FILL — Vasopressin IV Soln 20 Unit/ML (For IV Infusion): INTRAVENOUS | Qty: 1 | Status: AC

## 2020-04-03 LAB — CULTURE, BLOOD (ROUTINE X 2)
Culture: NO GROWTH
Special Requests: ADEQUATE

## 2020-04-04 LAB — FUNGITELL, SERUM
Fungitell Result: >500 pg/mL — ABNORMAL HIGH (ref <80)
Fungitell Result: >500 pg/mL — ABNORMAL HIGH (ref <80)

## 2020-04-26 NOTE — Progress Notes (Signed)
Patient passed away on vent at 0601, pronounced by Seward Grater, NP.

## 2020-04-26 NOTE — Progress Notes (Signed)
Jose called back with spanish interpreter. Elita Quick states that they were not expecting patient to pass and have not chosen a funeral home and states that this may take some time. Jose notified that patients body could be held in the morgue for 10 days but then Social services would be contacted for assistance. Elita Quick stated that he was on his way to the hospital.

## 2020-04-26 NOTE — Progress Notes (Signed)
Unable to obtain abg. Low sats, bp, glucose, and difficult to palpate pulse. Provider notified

## 2020-04-26 NOTE — Progress Notes (Addendum)
Chaplain responded to referral request from ICU secretary.   Chaplain offered emotional-spiritual support to deceased Pt's wife and son. Family members thanked Orthoptist for his offer. Wife responded that she found comfort in knowing Pt accepted God's love by way of Jesus Christ.  Chaplain shared encouraging words with family members.  Clenton Pare, MDiv Staff Chaplain- Relief

## 2020-04-26 NOTE — Progress Notes (Signed)
No interpreter available at present, Donald Mccall notified that his father has passed. He did verbalize understanding but requested a call back with an interpreter.

## 2020-04-26 NOTE — Death Summary Note (Signed)
DEATH SUMMARY   Patient Details  Name: Donald Mccall MRN: [id number] DOB: 16-Oct-1975  Admission/Discharge Information   Admit Date:  2020-03-23  Date of Death: Date of Death: 04/05/20  Time of Death: Time of Death: 0601  Length of Stay: 11/12/22  Referring Physician: Patient, No Pcp Per   Reason(s) for Hospitalization  COVID 19 pnuemonia  Diagnoses  Preliminary cause of death: COVID 19 pneumonia, ARDS Secondary Diagnoses (including complications and co-morbidities):  Active Problems:   Pneumonia due to COVID-19 virus   Goals of care, counseling/discussion   Palliative care by specialist   Acute respiratory failure with hypoxia [facility])   Brief Hospital Course (including significant findings, care, treatment, and services provided and events leading to death)  CRITICAL CARE NOTE 03/24/2023 admitted for COVID 19 pneumonia 8/25 remains on biPAP, high risk for intubation 8/26 remains hypoxic, high risk for intubation 8/28- patient took off his HFNC and got out of bed then collapsed on floor unresponsive and became cyanotic and severely hypoxemic.  I picked him up off the floor with help of [facility], intubated and placed central line , reviewed events with family.  Patient with severe ARDS critically ill.  8/30- patient remains on MV, weaned FIO2 from 100>>75, critically ill with ARDS 03/26/20- patient weaned to 65%-FiO2. Spoke to [facility] [place]) with interpreter service.  9/1- Patient is still on 65%, severe ards 03/28/20- spoke with family via translator, patient on vent with ARDS pna COVID 03/29/20- pCO2 remains > 100 in prone position, sat 88% FiO2 100   The patient's wife and clergy came to the bedside to attend move to supine. She was made aware of the severity of his hypoxia and the fact that he would likely have a severe decompensation, and perhaps expire. We explained the need to attempt supine position for significant facial pressure changes. She voiced her understanding  through the hospital interpreter. Further we discussed DNR, and visitation issues with COVID. She was understanding that Zoltan's lungs are failing and that CPR would not improve them, and so she agreed to no code blue. Her pastor discussed these issues with her to verify her understanding, and through the hospital interpreter completed the translation between all parties. She wished to be in hallway near bedside when he was flipped supine.  Patient passed away at 0601.   Pertinent Labs and Studies  Significant Diagnostic Studies DG Chest 1 View  Result Date: 03/26/2020 CLINICAL DATA:  Endotracheal tube, COVID-19. EXAM: CHEST  1 VIEW COMPARISON:  March 23, 2020. FINDINGS: The heart size and mediastinal contours are within normal limits. Endotracheal and nasogastric tubes are unchanged in position. Right internal jugular catheter is unchanged. Stable bilateral lung opacities are noted consistent with multifocal pneumonia. No pneumothorax or pleural effusion is noted. There is interval development of bilateral supraclavicular subcutaneous emphysema and possible pneumomediastinum. The visualized skeletal structures are unremarkable. IMPRESSION: Stable support apparatus. Stable bilateral lung opacities consistent with multifocal pneumonia. Interval development of bilateral supraclavicular subcutaneous emphysema and possible pneumomediastinum; CT scan of the chest is recommended for further evaluation. These results will be called to the ordering clinician or representative by the Radiologist Assistant, and communication documented in the PACS or zVision Dashboard. Electronically Signed   By: Lupita Raider M.D.   On: 03/26/2020 14:57   CT Angio Chest PE W and/or Wo Contrast  Result Date: 03-23-20 CLINICAL DATA:  Respiratory distress. EXAM: CT ANGIOGRAPHY CHEST WITH CONTRAST TECHNIQUE: Multidetector CT imaging of the chest was performed using  the standard protocol during bolus administration of  intravenous contrast. Multiplanar CT image reconstructions and MIPs were obtained to evaluate the vascular anatomy. CONTRAST:  100mL OMNIPAQUE IOHEXOL 350 MG/ML SOLN COMPARISON:  None. FINDINGS: Cardiovascular: No filling defects in the pulmonary arteries to suggest pulmonary emboli. Heart is normal size. Aorta normal caliber. Mediastinum/Nodes: No mediastinal, hilar, or axillary adenopathy. Trachea and esophagus are unremarkable. Thyroid unremarkable. Lungs/Pleura: Extensive ground-glass airspace opacities throughout both lungs. More confluent consolidation in the right lower lobe. Findings most compatible with pneumonia. No effusions. Upper Abdomen: Imaging into the upper abdomen demonstrates no acute findings. Musculoskeletal: Chest wall soft tissues are unremarkable. No acute bony abnormality. Review of the MIP images confirms the above findings. IMPRESSION: No evidence of pulmonary embolus. Extensive bilateral predominantly ground-glass opacities most compatible with pneumonia, likely COVID pneumonia. Electronically Signed   By: Charlett NoseKevin  Dover M.D.   On: 2020/01/19 23:02   DG Chest Port 1 View  Result Date: 03/29/2020 CLINICAL DATA:  Acute respiratory failure EXAM: PORTABLE CHEST 1 VIEW COMPARISON:  03/28/2020 FINDINGS: An endotracheal tube with tip 4 cm above the carina, RIGHT IJ central venous catheter with tip overlying the SUPERIOR cavoatrial junction, and NG tube entering the stomach with tip off the field of view again noted. Bilateral airspace opacities are stable to slightly increased. No large pleural effusion or pneumothorax identified. IMPRESSION: Stable to slightly increased bilateral airspace opacities. Electronically Signed   By: Harmon PierJeffrey  Hu M.D.   On: 03/29/2020 05:44   DG Chest Port 1 View  Result Date: 03/28/2020 CLINICAL DATA:  Acute respiratory failure.  COVID positive. EXAM: PORTABLE CHEST 1 VIEW COMPARISON:  03/26/2020.  CT 2020/01/19. FINDINGS: Endotracheal tube, NG tube, right IJ line  in stable position. Heart size stable. Pneumomediastinum again noted. Diffuse severe bilateral pulmonary infiltrates again noted. No pleural effusion. No definite pneumothorax. Bilateral neck and chest wall subcutaneous emphysema again noted. IMPRESSION: 1.  Lines and tubes in stable position. 2. Pneumomediastinum again noted. Bilateral neck and chest wall subcutaneous emphysema again noted. No definite pneumothorax. 3. Diffuse severe bilateral pulmonary infiltrates again noted. Similar finding on prior exam. Electronically Signed   By: Maisie Fushomas  Register   On: 03/28/2020 05:46   DG Chest Port 1 View  Result Date: 03/23/2020 CLINICAL DATA:  Intubation.  COVID-19 positive. EXAM: PORTABLE CHEST 1 VIEW COMPARISON:  March 23, 2020 FINDINGS: The ETT is in good position. Cardiomediastinal silhouette is stable. There is a right IJ terminating in the central SVC. The distal tip of the NG tube is in the region of the gastric cardia. I suspect the side port is just above the GE junction. No pneumothorax. Diffuse bilateral pulmonary opacities are mildly worsened in the interval. No other acute interval changes. IMPRESSION: 1. The NG tube distal tip is in the region of the gastric cardia. I suspect the side port is just above the GE junction. Recommend advancing several cm. 2. Other support apparatus as above. 3. Worsening bilateral pulmonary infiltrates consistent with the patient's known COVID-19 pneumonia. Electronically Signed   By: Gerome Samavid  Williams III M.D   On: 03/23/2020 10:52   DG Chest Port 1 View  Result Date: 03/23/2020 CLINICAL DATA:  Acute respiratory failure EXAM: PORTABLE CHEST 1 VIEW COMPARISON:  2020/01/19 FINDINGS: Extensive bilateral ground-glass opacities and patchy consolidations. No pleural effusion. Normal heart size. No pneumothorax IMPRESSION: Extensive bilateral ground-glass opacities and patchy consolidations consistent with multifocal pneumonia. Electronically Signed   By: Jasmine PangKim  Fujinaga M.D.    On: 03/23/2020 04:18  ECHOCARDIOGRAM COMPLETE  Result Date: 03/27/2020    ECHOCARDIOGRAM REPORT   Patient Name:   CAYLEN YARDLEY [facility] Date of Exam: 03/27/2020 Medical Rec #:  [id number]             Height:       68.0 in Accession #:    [id number]            Weight:       190.9 lb Date of Birth:  [birth year]             BSA:          2.004 m Patient Age:    44 years              BP:           113/70 mmHg Patient Gender: M                     HR:           101 bpm. Exam Location:  [facility] Procedure: Limited Echo Indications:     I31.3 Pericardial effusion  History:         Patient has no prior history of Echocardiogram examinations. Pt                  tested positive for COVID-19 on 03/10/20.  Sonographer:     Humphrey Rolls RDCS (AE) Referring Phys:  [id number] Salena Saner Diagnosing Phys: Debbe Odea MD  Sonographer Comments: Technically difficult study due to poor echo windows, no parasternal window, no apical window, suboptimal subcostal window and echo performed with patient supine and on artificial respirator. IMPRESSIONS  1. One view (subcostal 4 chamber obtained), showeing probably normal LVEF EF 50-55%. RV size and function looks normal. no other views obtained.     . The left ventricle has low normal function. Conclusion(s)/Recommendation(s): Poor windows for evaluation of left ventricular function by transthoracic echocardiography. Would recommend an alternative means of evaluation. No measurements or calculations were performed on this study. FINDINGS  Left Ventricle: One view (subcostal 4 chamber obtained), showeing probably normal LVEF EF 50-55%. RV size and function looks normal. no other views obtained. The left ventricle has low normal function. Debbe Odea MD Electronically signed by Debbe Odea MD Signature Date/Time: 03/27/2020/12:59:48 PM    Final     Microbiology Recent Results (from the past 240 hour(s))  Culture, respiratory (non-expectorated)     Status: None (Preliminary  result)   Collection Time: 03/29/20  3:45 AM   Specimen: Tracheal Aspirate; Respiratory  Result Value Ref Range Status   Specimen Description   Final    TRACHEAL ASPIRATE Performed at [facility], [address] [place], [place] [address]    Special Requests   Final    NONE Performed at [facility], [address] [place], [place] [address]    Gram Stain   Final    RARE WBC PRESENT,BOTH PMN AND MONONUCLEAR FEW GRAM POSITIVE COCCI RARE GRAM NEGATIVE RODS RARE GRAM VARIABLE ROD RARE YEAST Performed at [facility], [address] [place], [place] [address]    Culture ABUNDANT STAPHYLOCOCCUS EPIDERMIDIS  Final   Report Status PENDING  Incomplete  CULTURE, BLOOD (ROUTINE X 2) w Reflex to ID Panel     Status: None (Preliminary result)   Collection Time: 03/29/20  6:38 AM   Specimen: BLOOD  Result Value Ref Range Status   Specimen Description BLOOD RIGHT [facility]  Final   Special  Requests   Final    BOTTLES DRAWN AEROBIC AND ANAEROBIC Blood Culture adequate volume   Culture  Setup Time   Final    Organism ID to follow GRAM POSITIVE COCCI AEROBIC BOTTLE ONLY CRITICAL RESULT CALLED TO, READ BACK BY AND VERIFIED WITH: SCOTT HALL 03/29/20 AT 2258 BY ACR Performed at [facility], [address], [place], [place] [address]    Culture [facility] POSITIVE COCCI  Final   Report Status PENDING  Incomplete  CULTURE, BLOOD (ROUTINE X 2) w Reflex to ID Panel     Status: None (Preliminary result)   Collection Time: 03/29/20  6:38 AM   Specimen: BLOOD  Result Value Ref Range Status   Specimen Description BLOOD LEFT HAND  Final   Special Requests   Final    BOTTLES DRAWN AEROBIC AND ANAEROBIC Blood Culture adequate volume   Culture   Final    NO GROWTH 2 DAYS Performed at [facility], [address] [place], [place] [address]    Report Status PENDING  Incomplete  Blood Culture ID Panel (Reflexed)     Status: Abnormal   Collection Time: 03/29/20   6:38 AM  Result Value Ref Range Status   Enterococcus faecalis NOT DETECTED NOT DETECTED Final   Enterococcus Faecium NOT DETECTED NOT DETECTED Final   Listeria monocytogenes NOT DETECTED NOT DETECTED Final   Staphylococcus species DETECTED (A) NOT DETECTED Final    Comment: CRITICAL RESULT CALLED TO, READ BACK BY AND VERIFIED WITH: SCOTT HALL 03/29/20 AT 2258 BY ACR    Staphylococcus aureus (BCID) NOT DETECTED NOT DETECTED Final   Staphylococcus epidermidis DETECTED (A) NOT DETECTED Final    Comment: Methicillin (oxacillin) resistant coagulase negative staphylococcus. Possible blood culture contaminant (unless isolated from more than one blood culture draw or clinical case suggests pathogenicity). No antibiotic treatment is indicated for blood  culture contaminants. CRITICAL RESULT CALLED TO, READ BACK BY AND VERIFIED WITH: SCOTT HALL 03/29/20 AT 2258 BY ACR    Staphylococcus lugdunensis NOT DETECTED NOT DETECTED Final   Streptococcus species NOT DETECTED NOT DETECTED Final   Streptococcus agalactiae NOT DETECTED NOT DETECTED Final   Streptococcus pneumoniae NOT DETECTED NOT DETECTED Final   Streptococcus pyogenes NOT DETECTED NOT DETECTED Final   A.calcoaceticus-baumannii NOT DETECTED NOT DETECTED Final   Bacteroides fragilis NOT DETECTED NOT DETECTED Final   Enterobacterales NOT DETECTED NOT DETECTED Final   Enterobacter cloacae complex NOT DETECTED NOT DETECTED Final   Escherichia coli NOT DETECTED NOT DETECTED Final   Klebsiella aerogenes NOT DETECTED NOT DETECTED Final   Klebsiella oxytoca NOT DETECTED NOT DETECTED Final   Klebsiella pneumoniae NOT DETECTED NOT DETECTED Final   Proteus species NOT DETECTED NOT DETECTED Final   Salmonella species NOT DETECTED NOT DETECTED Final   Serratia marcescens NOT DETECTED NOT DETECTED Final   Haemophilus influenzae NOT DETECTED NOT DETECTED Final   Neisseria meningitidis NOT DETECTED NOT DETECTED Final   Pseudomonas aeruginosa NOT DETECTED  NOT DETECTED Final   Stenotrophomonas maltophilia NOT DETECTED NOT DETECTED Final   Candida albicans NOT DETECTED NOT DETECTED Final   Candida auris NOT DETECTED NOT DETECTED Final   Candida glabrata NOT DETECTED NOT DETECTED Final   Candida krusei NOT DETECTED NOT DETECTED Final   Candida parapsilosis NOT DETECTED NOT DETECTED Final   Candida tropicalis NOT DETECTED NOT DETECTED Final   Cryptococcus neoformans/gattii NOT DETECTED NOT DETECTED Final   Methicillin resistance mecA/C DETECTED (A) NOT DETECTED Final    Comment: CRITICAL RESULT  CALLED TO, READ BACK BY AND VERIFIED WITH: SCOTT HALL 03/29/20 AT 2258 BY ACR Performed at [facility], [address] [place], [place] [address]     Lab Basic Metabolic Panel: Recent Labs  Lab 03/26/20 0520 03/26/20 0520 03/27/20 0440 03/28/20 0410 03/29/20 0331 03/29/20 1808 03/30/20 0344  NA 139   < > 141 142 144 143 143  K 5.3*   < > 5.4* 4.9 5.3* 5.3* 5.5*  CL 103   < > 101 99 100 99 100  CO2 29   < > 33* 36* 38* 31 26  GLUCOSE 175*   < > 192* 233* 247* 177* 169*  BUN 33*   < > 29* 36* 40* 65* 77*  CREATININE 0.88   < > 0.89 0.77 1.14 3.36* 4.37*  CALCIUM 8.0*   < > 8.3* 8.4* 8.2* 7.5* 7.6*  MG 2.8*  --  2.5* 2.5* 3.0*  --  3.4*  PHOS 3.3  --  3.0 1.5* 3.9  --  6.7*   < > = values in this interval not displayed.   Liver Function Tests: No results for input(s): AST, ALT, ALKPHOS, BILITOT, PROT, ALBUMIN in the last 168 hours. No results for input(s): LIPASE, AMYLASE in the last 168 hours. No results for input(s): AMMONIA in the last 168 hours. CBC: Recent Labs  Lab 03/26/20 0520 03/27/20 0440 03/28/20 0410 03/29/20 0331 03/30/20 0344  WBC 18.4* 23.1* 21.0* 19.1* 16.3*  HGB 13.2 13.0 12.6* 12.7* 10.9*  HCT 39.6 39.7 39.1 41.6 36.6*  MCV 93.4 94.3 96.1 101.5* 102.5*  PLT 329 302 237 267 214   Cardiac Enzymes: No results for input(s): CKTOTAL, CKMB, CKMBINDEX, TROPONINI in the last 168 hours. Sepsis Labs: Recent  Labs  Lab 03/27/20 0440 03/28/20 0410 03/29/20 0331 03/30/20 0344  PROCALCITON  --  3.66 6.75 82.51  WBC 23.1* 21.0* 19.1* 16.3*      Bentleigh Waren 04/19/20, 7:30 AM

## 2020-04-26 DEATH — deceased

## 2022-04-12 IMAGING — DX DG CHEST 1V PORT
1 series · 1 of 1 positions shown · non-contrast
Comparison: March 23, 2020

CLINICAL DATA: Intubation.  3TEW3-3E positive.

EXAM:
PORTABLE CHEST 1 VIEW

[chest ap]
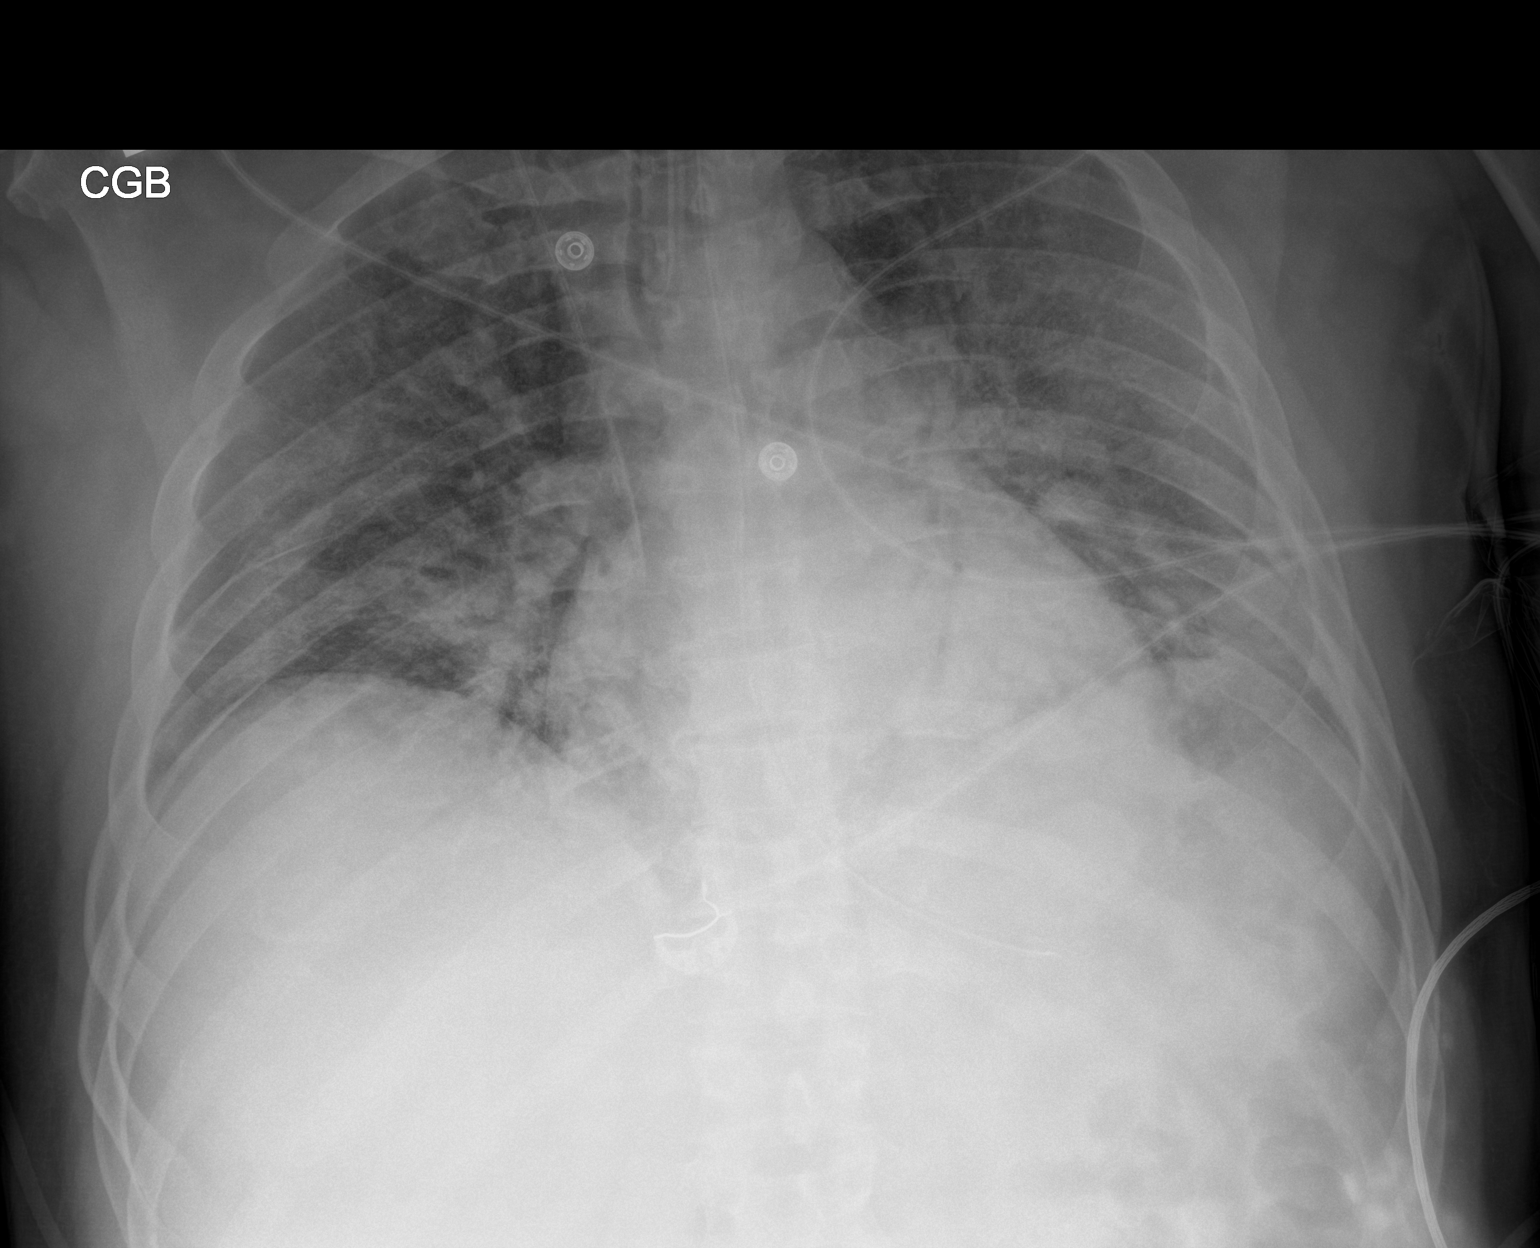

[1 of 1 positions shown; findings below may reference images not displayed]

FINDINGS: The ETT is in good position. Cardiomediastinal silhouette is stable.
There is a right IJ terminating in the central SVC. The distal tip
of the NG tube is in the region of the gastric cardia. I suspect the
side port is just above the GE junction. No pneumothorax. Diffuse
bilateral pulmonary opacities are mildly worsened in the interval.
No other acute interval changes.
IMPRESSION: 1. The NG tube distal tip is in the region of the gastric cardia. I
suspect the side port is just above the GE junction. Recommend
advancing several cm.
2. Other support apparatus as above.
3. Worsening bilateral pulmonary infiltrates consistent with the
patient's known 3TEW3-3E pneumonia.

## 2022-04-18 IMAGING — DX DG CHEST 1V PORT
1 series · 1 of 1 positions shown · non-contrast
Comparison: 03/28/2020

CLINICAL DATA: Acute respiratory failure

EXAM:
PORTABLE CHEST 1 VIEW

[chest ap]
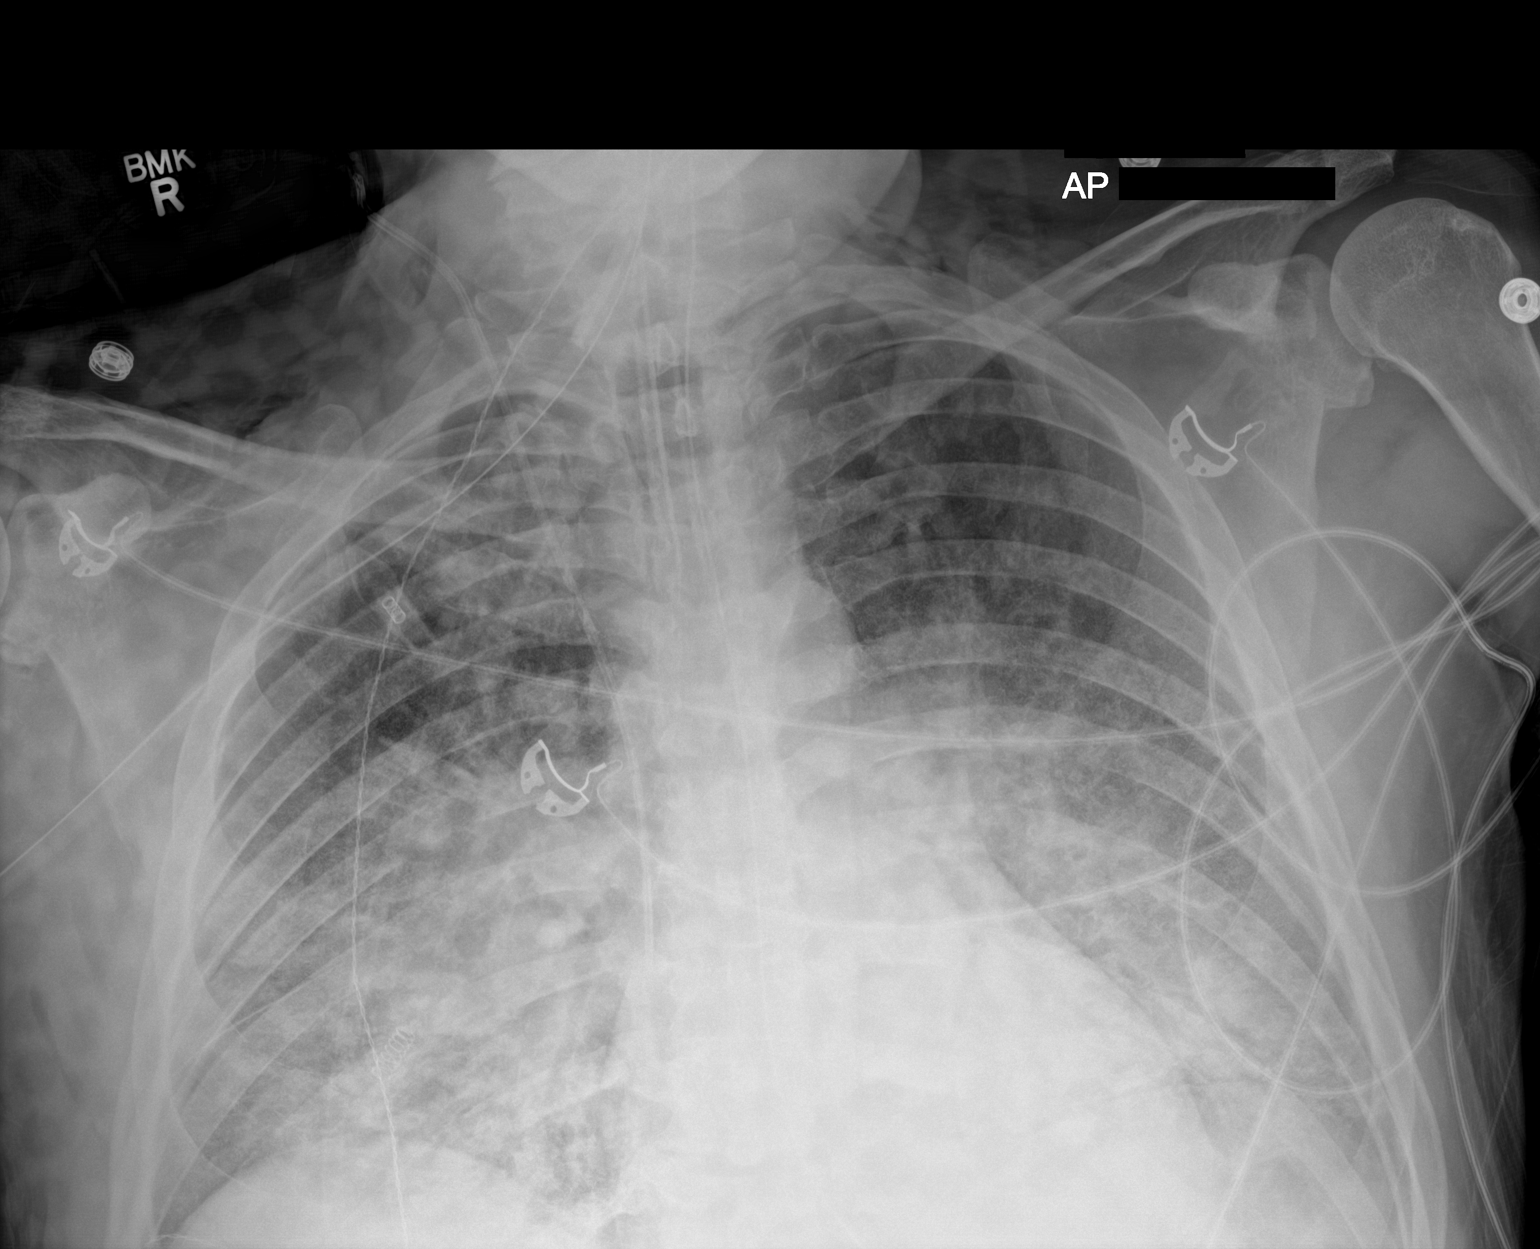

[1 of 1 positions shown; findings below may reference images not displayed]

FINDINGS: An endotracheal tube with tip 4 cm above the carina, RIGHT IJ
central venous catheter with tip overlying the SUPERIOR cavoatrial
junction, and NG tube entering the stomach with tip off the field of
view again noted.

Bilateral airspace opacities are stable to slightly increased.

No large pleural effusion or pneumothorax identified.
IMPRESSION: Stable to slightly increased bilateral airspace opacities.
